# Patient Record
Sex: Female | Born: 1960 | Race: White | Hispanic: No | Marital: Married | Smoking: Former smoker
Health system: Southern US, Community
[De-identification: ages and names within clinical notes are randomized; demographics above are authoritative.]

## PROBLEM LIST (undated history)

## (undated) DIAGNOSIS — R609 Edema, unspecified: Secondary | ICD-10-CM

## (undated) DIAGNOSIS — D649 Anemia, unspecified: Secondary | ICD-10-CM

## (undated) DIAGNOSIS — M1990 Unspecified osteoarthritis, unspecified site: Secondary | ICD-10-CM

## (undated) DIAGNOSIS — Z9889 Other specified postprocedural states: Secondary | ICD-10-CM

## (undated) DIAGNOSIS — T8040XA Rh incompatibility reaction due to transfusion of blood or blood products, unspecified, initial encounter: Secondary | ICD-10-CM

## (undated) DIAGNOSIS — R112 Nausea with vomiting, unspecified: Secondary | ICD-10-CM

## (undated) DIAGNOSIS — L509 Urticaria, unspecified: Secondary | ICD-10-CM

## (undated) DIAGNOSIS — Z8489 Family history of other specified conditions: Secondary | ICD-10-CM

## (undated) DIAGNOSIS — G5600 Carpal tunnel syndrome, unspecified upper limb: Secondary | ICD-10-CM

## (undated) DIAGNOSIS — I10 Essential (primary) hypertension: Secondary | ICD-10-CM

## (undated) DIAGNOSIS — R51 Headache: Secondary | ICD-10-CM

## (undated) DIAGNOSIS — E7800 Pure hypercholesterolemia, unspecified: Secondary | ICD-10-CM

## (undated) DIAGNOSIS — K5090 Crohn's disease, unspecified, without complications: Secondary | ICD-10-CM

## (undated) DIAGNOSIS — M5020 Other cervical disc displacement, unspecified cervical region: Secondary | ICD-10-CM

## (undated) DIAGNOSIS — G709 Myoneural disorder, unspecified: Secondary | ICD-10-CM

## (undated) DIAGNOSIS — K219 Gastro-esophageal reflux disease without esophagitis: Secondary | ICD-10-CM

## (undated) HISTORY — PX: OVARY SURGERY: SHX727

## (undated) HISTORY — PX: BREAST LUMPECTOMY: SHX2

## (undated) HISTORY — PX: CHOLECYSTECTOMY: SHX55

## (undated) HISTORY — PX: IMPLANTATION VAGAL NERVE STIMULATOR: SUR692

## (undated) HISTORY — PX: APPENDECTOMY: SHX54

## (undated) HISTORY — PX: HERNIA REPAIR: SHX51

## (undated) HISTORY — PX: KNEE ARTHROSCOPY: SUR90

## (undated) HISTORY — PX: DILATION AND CURETTAGE OF UTERUS: SHX78

## (undated) HISTORY — PX: OTHER SURGICAL HISTORY: SHX169

## (undated) HISTORY — PX: TUBAL LIGATION: SHX77

## (undated) HISTORY — PX: MYOMECTOMY: SHX85

---
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
## 2009-10-16 HISTORY — PX: CARPAL TUNNEL RELEASE: SHX101

## 2011-01-15 DIAGNOSIS — L509 Urticaria, unspecified: Secondary | ICD-10-CM

## 2011-01-15 HISTORY — DX: Urticaria, unspecified: L50.9

## 2012-03-02 ENCOUNTER — Encounter (HOSPITAL_COMMUNITY): Payer: Self-pay

## 2012-03-02 ENCOUNTER — Emergency Department (HOSPITAL_COMMUNITY)
Admission: EM | Admit: 2012-03-02 | Discharge: 2012-03-02 | Disposition: A | Discharge status: Home / Self Care | Payer: Medicare Other | Attending: Emergency Medicine | Admitting: Emergency Medicine

## 2012-03-02 DIAGNOSIS — IMO0001 Reserved for inherently not codable concepts without codable children: Secondary | ICD-10-CM | POA: Insufficient documentation

## 2012-03-02 DIAGNOSIS — R000 Tachycardia, unspecified: Secondary | ICD-10-CM | POA: Insufficient documentation

## 2012-03-02 DIAGNOSIS — K5090 Crohn's disease, unspecified, without complications: Secondary | ICD-10-CM | POA: Insufficient documentation

## 2012-03-02 DIAGNOSIS — I10 Essential (primary) hypertension: Secondary | ICD-10-CM | POA: Insufficient documentation

## 2012-03-02 DIAGNOSIS — R200 Anesthesia of skin: Secondary | ICD-10-CM

## 2012-03-02 DIAGNOSIS — M79609 Pain in unspecified limb: Secondary | ICD-10-CM | POA: Insufficient documentation

## 2012-03-02 DIAGNOSIS — R51 Headache: Secondary | ICD-10-CM | POA: Insufficient documentation

## 2012-03-02 DIAGNOSIS — R209 Unspecified disturbances of skin sensation: Secondary | ICD-10-CM | POA: Insufficient documentation

## 2012-03-02 DIAGNOSIS — Z79899 Other long term (current) drug therapy: Secondary | ICD-10-CM | POA: Insufficient documentation

## 2012-03-02 DIAGNOSIS — M62838 Other muscle spasm: Secondary | ICD-10-CM | POA: Insufficient documentation

## 2012-03-02 HISTORY — DX: Crohn's disease, unspecified, without complications: K50.90

## 2012-03-02 HISTORY — DX: Essential (primary) hypertension: I10

## 2012-03-02 LAB — POCT I-STAT, CHEM 8
BUN: 14 mg/dL (ref 6–23)
Creatinine, Ser: 0.9 mg/dL (ref 0.50–1.10)
Potassium: 3.3 mEq/L — ABNORMAL LOW (ref 3.5–5.1)
Sodium: 136 mEq/L (ref 135–145)
TCO2: 24 mmol/L (ref 0–100)

## 2012-03-02 MED ORDER — OXYCODONE-ACETAMINOPHEN 5-325 MG PO TABS: 1.0000 | ORAL_TABLET | Freq: Four times a day (QID) | ORAL | Status: AC | PRN

## 2012-03-02 MED ORDER — KETOROLAC TROMETHAMINE 30 MG/ML IJ SOLN
30.0000 mg | Freq: Once | INTRAMUSCULAR | Status: AC
  Administered 2012-03-02: 30 mg via INTRAVENOUS
  Filled 2012-03-02: qty 1

## 2012-03-02 MED ORDER — DIPHENHYDRAMINE HCL 50 MG/ML IJ SOLN
25.0000 mg | Freq: Once | INTRAMUSCULAR | Status: AC
  Administered 2012-03-02: 11:00:00 via INTRAVENOUS
  Filled 2012-03-02: qty 1

## 2012-03-02 MED ORDER — ALPRAZOLAM 0.5 MG PO TABS: 0.5000 mg | ORAL_TABLET | Freq: Every evening | ORAL | Status: DC | PRN

## 2012-03-02 MED ORDER — POTASSIUM CHLORIDE CRYS ER 20 MEQ PO TBCR
20.0000 meq | EXTENDED_RELEASE_TABLET | Freq: Once | ORAL | Status: AC
  Administered 2012-03-02: 20 meq via ORAL
  Filled 2012-03-02: qty 1

## 2012-03-02 MED ORDER — OXYCODONE-ACETAMINOPHEN 5-325 MG PO TABS
1.0000 | ORAL_TABLET | Freq: Once | ORAL | Status: AC
  Administered 2012-03-02: 1 via ORAL
  Filled 2012-03-02: qty 1

## 2012-03-02 MED ORDER — LORAZEPAM 2 MG/ML IJ SOLN
1.0000 mg | Freq: Once | INTRAMUSCULAR | Status: AC
  Administered 2012-03-02: 09:00:00 via INTRAVENOUS
  Filled 2012-03-02: qty 1

## 2012-03-02 MED ORDER — LORAZEPAM 2 MG/ML IJ SOLN
1.0000 mg | Freq: Once | INTRAMUSCULAR | Status: AC
  Administered 2012-03-02: 11:00:00 via INTRAVENOUS
  Filled 2012-03-02: qty 1

## 2012-03-02 MED ORDER — SODIUM CHLORIDE 0.9 % IV BOLUS (SEPSIS)
1000.0000 mL | Freq: Once | INTRAVENOUS | Status: AC
  Administered 2012-03-02: 1000 mL via INTRAVENOUS

## 2012-03-02 NOTE — ED Notes (Signed)
Per EMS: Pt stated at 0100 she started to experience tingling and numbness in hands and legs. Right hand contraction which has progressed more than when arriving to see pt. Hx of HTN and Crohn's disease. No recent trauma. Pain scale:0/10. VS: 158/96, HR102, RR20, Sinus Tach; CBG 103. 4L [place]. Pt took Topamax 1/2 dose last night before bed. On scene pt could ambulate without problems and walked to truck. 20 Left AC locked.

## 2012-03-02 NOTE — ED Provider Notes (Signed)
History     CSN: [id number]  Arrival date & time 03/02/12  4098   First MD Initiated Contact with Patient 03/02/12 [phone]      Chief Complaint  Patient presents with  . Numbness    hands and feet    (Consider location/radiation/quality/duration/timing/severity/associated sxs/prior treatment) HPI  51 year old female with history of hypertension history of Crohn's disease presents complaining of muscle tightness and tingling sensation to her extremities. Patient states this morning she woke up experiencing a tightness sensation to both hands and feet. She describes sensation as muscle pulling tightly.  Onset was gradual, and persistent. The patient started from the distal extremities pain radiates towards her trunk. She noticed that her hands are contracted and having difficulty moving her fingers. She also complained of muscle pain due to the tightness. She was having difficulty walking and was afraid that she may fall. Patient denies fever, headache, double vision, nausea, vomiting, diarrhea, chest pain, shortness of breath, abdominal pain, back pain. She recall having headache last night and proceeds to take one half of Topamax, and a prescription that she receive several years ago for migraine headache. She denies taking Topamax a regular basis. Patient also acknowledged that she has been under a lot of stress recently due to the death of a friend 2 weeks ago and funeral a couple days ago.  She denies any recent recreational drug use or alcohol use. She does have a history of Crohn's disease but is currently uncontrolled. She has been eating and drinking as usual. Patient denies difficulty thinking or talking.  Past Medical History  Diagnosis Date  . Hypertension   . Crohn's disease     Past Surgical History  Procedure Date  . Carpal tunnel release 2011  . Cholecystectomy   . Appendectomy   . Hernia repair   . Tubal ligation     Family History  Problem Relation Age of Onset  .  Hypertension Other   . Stroke Other   . Heart attack Other     History  Substance Use Topics  . Smoking status: Former Smoker    Types: Cigarettes    Quit date: 03/02/2002  . Smokeless tobacco: Not on file  . Alcohol Use: 3.0 oz/week    5 Glasses of wine per week    OB History    Grav Para Term Preterm Abortions TAB SAB Ect Mult Living                  Review of Systems  All other systems reviewed and are negative.    Allergies  Sulfa antibiotics  Home Medications   Current Outpatient Rx  Name Route Sig Dispense Refill  . CALCIUM + D PO Oral Take 1 tablet by mouth daily.    Marland Kitchen ESOMEPRAZOLE MAGNESIUM 40 MG PO CPDR Oral Take 40 mg by mouth daily before breakfast.    . ETODOLAC 300 MG PO CAPS Oral Take 150 mg by mouth every 8 (eight) hours as needed. For migraines    . EZETIMIBE-SIMVASTATIN 10-20 MG PO TABS Oral Take 1 tablet by mouth at bedtime.    Marland Kitchen MELATIN PO Oral Take 1 tablet by mouth at bedtime as needed. For sleep    . MESALAMINE 400 MG PO TBEC Oral Take 800 mg by mouth 3 (three) times daily.    . MULTI-VITAMIN/MINERALS PO TABS Oral Take 1 tablet by mouth daily.    . OXYCODONE-ACETAMINOPHEN 5-325 MG PO TABS Oral Take 1 tablet by mouth every 6 (six)  hours as needed. For pain    . PRESCRIPTION MEDICATION Oral Take 0.5 tablets by mouth every 8 (eight) hours as needed. Topamax dose unknown. For migraines    . PRESCRIPTION MEDICATION Injection Inject 1 application as directed every 14 (fourteen) days. Vitamin B-12 injection    . VALSARTAN-HYDROCHLOROTHIAZIDE 160-25 MG PO TABS Oral Take 1 tablet by mouth daily.      BP 143/84  Pulse 103  Temp(Src) 97.9 F (36.6 C) (Oral)  Resp 20  SpO2 97%  LMP 02/19/2012  Physical Exam  Nursing note and vitals reviewed. Constitutional: She is oriented to person, place, and time. She appears well-developed and well-nourished.       Appears tearful and mildly hyperventilating.    HENT:  Head: Normocephalic and atraumatic.    Mouth/Throat: Oropharynx is clear and moist. No oropharyngeal exudate.  Eyes: Conjunctivae and EOM are normal.  Neck: Normal range of motion. Neck supple.  Cardiovascular: Regular rhythm.        tachycardic  Pulmonary/Chest: Effort normal and breath sounds normal. She has no wheezes. She has no rales. She exhibits no tenderness.  Abdominal: Soft. There is no tenderness.  Musculoskeletal:       Pt holds both hands in slight contracture (flexion), most noticeable to thumb.  Normal ROM with passive assist. No swelling or rash noted to all extremities.    Neurological: She is alert and oriented to person, place, and time. She has normal strength. No cranial nerve deficit. She displays a negative Romberg sign. Coordination and gait normal. GCS eye subscore is 4. GCS verbal subscore is 5. GCS motor subscore is 6.  Reflex Scores:      Patellar reflexes are 2+ on the right side and 2+ on the left side.      Decreased sensation to both hands with 2 point discrimination.  Normal sensation to both feet.  Normal gait, Romberg negative, normal DTR.  Normal heels to shin.    Skin: Skin is warm. No rash noted.    ED Course  Procedures (including critical care time)  Labs Reviewed - No data to display No results found.   No diagnosis found.  Results for orders placed during the hospital encounter of 03/02/12  POCT I-STAT, CHEM 8      Component Value Range   Sodium 136  135 - 145 (mEq/L)   Potassium 3.3 (*) 3.5 - 5.1 (mEq/L)   Chloride 101  96 - 112 (mEq/L)   BUN 14  6 - 23 (mg/dL)   Creatinine, Ser 1.61  0.50 - 1.10 (mg/dL)   Glucose, Bld 096 (*) 70 - 99 (mg/dL)   Calcium, Ion 0.45 (*) 1.12 - 1.32 (mmol/L)   TCO2 24  0 - 100 (mmol/L)   Hemoglobin 13.9  12.0 - 15.0 (g/dL)   HCT 40.9  81.1 - 91.4 (%)   No results found.     MDM  Pt experienced dystonia with muscle spasm and contraction to her hands.  Doubt stroke related.  Having decreased sensation to hands bilat.  Has been under a lot  of stress.  This may induce carpal spasm.  Plan to give ativan, IVF, and toradol.  Will continue to monitor.  Discussed with my attending.     10:51 AM Pt experienced some improvement with 1mg  ativan and toradol.  Labs unremarkable.  Mild hypokalemia of 3.3.  Supplementation given.    1:17 PM Pt having minimal improvement.  My attending has seen and evaluated pt.  Plan to d/c  with ativan and pain medication along with f/u to PCP in [place].  Strict f/u precaution explained.  Pt currently stable, normal vital sign, A&Ox4    Fayrene Helper, PA-C 03/02/12 1318

## 2012-03-02 NOTE — Discharge Instructions (Signed)
Hypertonia Disease Hypertonia is a condition marked by an abnormal increase in muscle tension and a reduced ability of a muscle to stretch. It is caused by injury to pathways in the central nervous system. These pathways carry information from the central nervous system to the muscles. They control:  Posture.   Muscle tone.   Reflexes.  When the injury occurs in children under the age of 2, the term cerebral palsy is often used. Hypertonia can be so severe that joint movement is not possible. Untreated, it can lead to loss of function and deformity.  CAUSES  Hypertonia may result from:   Injury.   Disease.   Conditions such as:   Spasticity.   Dystonia.   Rigidity.  SYMPTOMS  Symptoms of the three conditions noted above are: Spastic hypertonia:  Uncontrollable muscle spasms.   Stiffening or straightening out of muscles.   Shock-like contractions of all or part of a group of muscles.   Abnormal muscle tone.  It is seen in other disorders. They include:  Cerebral palsy.   Stroke.   Spinal cord injury.  Dystonic hypertonia:   Muscle resistance to passive stretching. A therapist gently stretches the inactive contracted muscle to a comfortable length. This is done at very low speeds of movement.   A tendency of a limb to return to a fixed involuntary (and sometimes abnormal) posture after movement.  Rigidity:   An involuntary stiffening or straightening out of muscles.   Abnormally increased muscle tone.   Reduced ability of a muscle to stretch.   This type of hypertonia is most common in parkinsonism  TREATMENT   Drugs such as:   Baclofen, diazepam, and dantrolene may be prescribed to reduce spasticity.   Injections of botulinum toxin have recently been used for longstanding hypertonia in cerebral palsy, spasticity, and other disorders.   Rehabilitative treatment may involve:   Range of motion exercises.   Active stretching exercises.   Occupational  therapy.   In severe cases, a surgical procedure may be used. In it, the nerves that cause spasticity are cut.   Dystonic hypertonia and rigidity can be treated with therapies aimed at the underlying disorders.  PROGNOSIS Hypertonia is sometimes painful. It may lead to:  Functional limitations.   Disability.   In severe cases, reduced quality of life.  Document Released: 09/22/2002 Document Revised: 09/21/2011 Document Reviewed: 10/02/2005 [facility] Patient Information 2012 [place], [place].

## 2012-03-02 NOTE — ED Provider Notes (Signed)
Medical screening examination/treatment/procedure(s) were conducted as a shared visit with non-physician practitioner(s) and myself.  I personally evaluated the patient during the encounter  She awoke today with her "body locked up". This has caused her to have pain with movements of shoulders, elbows, wrists, and hands. Does have sensation of generalized numbness. She denies trauma. She reports her stress is "through the roof". She has multiple stressors. She does not have a therapist. Her PCP is in [place]. On exam, she is alert, cooperative, and very anxious. Neurologic exam is nonfocal. She has intermittent clenching of both hands that voluntarily relax, on command when distracted. I doubt CVA, acute spinal disorder, metabolic instability or occult infection. Patient stable for discharge with outpatient management.  Flint Melter, MD 03/02/12 1300

## 2012-03-03 NOTE — ED Provider Notes (Signed)
Medical screening examination/treatment/procedure(s) were conducted as a shared visit with non-physician practitioner(s) and myself.  I personally evaluated the patient during the encounter   Severe stress with somatic manifestation. Dobt CVA, Carpal Pedal Spasm, medication rxn, or metabolic instability.  Flint Melter, MD 03/03/12 [phone]

## 2012-03-15 ENCOUNTER — Other Ambulatory Visit: Payer: Self-pay | Admitting: Orthopedic Surgery

## 2012-03-26 NOTE — Pre-Procedure Instructions (Signed)
20 Tanya Mcdaniel  03/26/2012   Your procedure is scheduled on:  Wednesday April 03, 2012.  Report to [facility] at 0630 AM.  Call this number if you have problems the morning of surgery: [phone]   Remember:   Do not eat food or drink:After Midnight.    Take these medicines the morning of surgery with A SIP OF WATER: Alprazolam (Xanax), Esomeprazole (Nexium), Oxycodone (Percocet) if needed for pain, and Mesalamine (Asacol).   Do not wear jewelry, make-up or nail polish.  Do not wear lotions, powders, or perfumes.   Do not shave 48 hours prior to surgery.  Do not bring valuables to the hospital.  Contacts, dentures or bridgework may not be worn into surgery.  Leave suitcase in the car. After surgery it may be brought to your room.  For patients admitted to the hospital, checkout time is 11:00 AM the day of discharge.   Patients discharged the day of surgery will not be allowed to drive home.  Name and phone number of your driver:   Special Instructions: CHG Shower Use Special Wash: 1/2 bottle night before surgery and 1/2 bottle morning of surgery.   Please read over the following fact sheets that you were given: Pain Booklet, Coughing and Deep Breathing, MRSA Information and Surgical Site Infection Prevention

## 2012-03-27 ENCOUNTER — Encounter (HOSPITAL_COMMUNITY)
Admission: RE | Admit: 2012-03-27 | Discharge: 2012-03-27 | Disposition: A | Discharge status: Home / Self Care | Payer: Medicare Other | Source: Ambulatory Visit | Attending: Orthopedic Surgery | Admitting: Orthopedic Surgery

## 2012-03-27 ENCOUNTER — Encounter (HOSPITAL_COMMUNITY): Payer: Self-pay | Admitting: Respiratory Therapy

## 2012-03-27 ENCOUNTER — Encounter (HOSPITAL_COMMUNITY): Payer: Self-pay

## 2012-03-27 HISTORY — DX: Urticaria, unspecified: L50.9

## 2012-03-27 HISTORY — DX: Unspecified osteoarthritis, unspecified site: M19.90

## 2012-03-27 HISTORY — DX: Nausea with vomiting, unspecified: R11.2

## 2012-03-27 HISTORY — DX: Other specified postprocedural states: Z98.890

## 2012-03-27 HISTORY — DX: Other cervical disc displacement, unspecified cervical region: M50.20

## 2012-03-27 HISTORY — DX: Anemia, unspecified: D64.9

## 2012-03-27 HISTORY — DX: Family history of other specified conditions: Z84.89

## 2012-03-27 HISTORY — DX: Pure hypercholesterolemia, unspecified: E78.00

## 2012-03-27 HISTORY — DX: Headache: R51

## 2012-03-27 HISTORY — DX: Gastro-esophageal reflux disease without esophagitis: K21.9

## 2012-03-27 LAB — DIFFERENTIAL
Basophils Absolute: 0 10*3/uL (ref 0.0–0.1)
Basophils Relative: 0 % (ref 0–1)
Eosinophils Absolute: 0.1 10*3/uL (ref 0.0–0.7)
Lymphs Abs: 2.8 10*3/uL (ref 0.7–4.0)
Neutrophils Relative %: 64 % (ref 43–77)

## 2012-03-27 LAB — CBC
MCV: 91.7 fL (ref 78.0–100.0)
Platelets: 374 10*3/uL (ref 150–400)
RBC: 4.46 MIL/uL (ref 3.87–5.11)
RDW: 12.8 % (ref 11.5–15.5)
WBC: 10.4 10*3/uL (ref 4.0–10.5)

## 2012-03-27 LAB — URINALYSIS, ROUTINE W REFLEX MICROSCOPIC
Bilirubin Urine: NEGATIVE
Ketones, ur: NEGATIVE mg/dL
Nitrite: NEGATIVE
Protein, ur: NEGATIVE mg/dL
Specific Gravity, Urine: 1.01 (ref 1.005–1.030)
Urobilinogen, UA: 0.2 mg/dL (ref 0.0–1.0)

## 2012-03-27 LAB — COMPREHENSIVE METABOLIC PANEL
Alkaline Phosphatase: 91 U/L (ref 39–117)
BUN: 10 mg/dL (ref 6–23)
Chloride: 97 mEq/L (ref 96–112)
GFR calc Af Amer: >90 mL/min (ref >90)
GFR calc non Af Amer: >90 mL/min (ref >90)
Glucose, Bld: 123 mg/dL — ABNORMAL HIGH (ref 70–99)
Sodium: 137 mEq/L (ref 135–145)
Total Protein: 7.8 g/dL (ref 6.0–8.3)

## 2012-03-27 LAB — PROTIME-INR
INR: 0.94 (ref 0.00–1.49)
Prothrombin Time: 12.8 seconds (ref 11.6–15.2)

## 2012-04-02 MED ORDER — CLINDAMYCIN PHOSPHATE 600 MG/50ML IV SOLN
600.0000 mg | INTRAVENOUS | Status: AC
  Administered 2012-04-03: 600 mg via INTRAVENOUS
  Filled 2012-04-02: qty 50

## 2012-04-03 ENCOUNTER — Inpatient Hospital Stay (HOSPITAL_COMMUNITY): Payer: Medicare Other | Admitting: *Deleted

## 2012-04-03 ENCOUNTER — Encounter (HOSPITAL_COMMUNITY): Payer: Self-pay | Admitting: *Deleted

## 2012-04-03 ENCOUNTER — Inpatient Hospital Stay (HOSPITAL_COMMUNITY): Payer: Medicare Other

## 2012-04-03 ENCOUNTER — Inpatient Hospital Stay (HOSPITAL_COMMUNITY)
Admission: RE | Admit: 2012-04-03 | Discharge: 2012-04-04 | DRG: 473 | Disposition: A | Discharge status: Home / Self Care | Payer: Medicare Other | Source: Ambulatory Visit | Attending: Orthopedic Surgery | Admitting: Orthopedic Surgery

## 2012-04-03 ENCOUNTER — Encounter (HOSPITAL_COMMUNITY)
Admission: RE | Disposition: A | Discharge status: Home / Self Care | Payer: Self-pay | Source: Ambulatory Visit | Attending: Orthopedic Surgery

## 2012-04-03 DIAGNOSIS — Z882 Allergy status to sulfonamides status: Secondary | ICD-10-CM

## 2012-04-03 DIAGNOSIS — E119 Type 2 diabetes mellitus without complications: Secondary | ICD-10-CM | POA: Diagnosis present

## 2012-04-03 DIAGNOSIS — E7800 Pure hypercholesterolemia, unspecified: Secondary | ICD-10-CM | POA: Diagnosis present

## 2012-04-03 DIAGNOSIS — Z87891 Personal history of nicotine dependence: Secondary | ICD-10-CM

## 2012-04-03 DIAGNOSIS — Z8249 Family history of ischemic heart disease and other diseases of the circulatory system: Secondary | ICD-10-CM

## 2012-04-03 DIAGNOSIS — M4712 Other spondylosis with myelopathy, cervical region: Principal | ICD-10-CM | POA: Diagnosis present

## 2012-04-03 DIAGNOSIS — Z01812 Encounter for preprocedural laboratory examination: Secondary | ICD-10-CM

## 2012-04-03 DIAGNOSIS — Z823 Family history of stroke: Secondary | ICD-10-CM

## 2012-04-03 DIAGNOSIS — Z01818 Encounter for other preprocedural examination: Secondary | ICD-10-CM

## 2012-04-03 DIAGNOSIS — K219 Gastro-esophageal reflux disease without esophagitis: Secondary | ICD-10-CM | POA: Diagnosis present

## 2012-04-03 DIAGNOSIS — I10 Essential (primary) hypertension: Secondary | ICD-10-CM | POA: Diagnosis present

## 2012-04-03 DIAGNOSIS — G959 Disease of spinal cord, unspecified: Secondary | ICD-10-CM

## 2012-04-03 HISTORY — PX: ANTERIOR CERVICAL DECOMP/DISCECTOMY FUSION: SHX1161

## 2012-04-03 SURGERY — ANTERIOR CERVICAL DECOMPRESSION/DISCECTOMY FUSION 2 LEVELS
Anesthesia: General | Site: Spine Cervical | Laterality: Bilateral | Wound class: Clean

## 2012-04-03 MED ORDER — HYDROCHLOROTHIAZIDE 25 MG PO TABS
25.0000 mg | ORAL_TABLET | Freq: Every day | ORAL | Status: DC
  Filled 2012-04-03 (×2): qty 1

## 2012-04-03 MED ORDER — POTASSIUM CHLORIDE IN NACL 20-0.9 MEQ/L-% IV SOLN
INTRAVENOUS | Status: DC
  Filled 2012-04-03 (×3): qty 1000

## 2012-04-03 MED ORDER — DIAZEPAM 5 MG PO TABS
5.0000 mg | ORAL_TABLET | Freq: Four times a day (QID) | ORAL | Status: DC | PRN
  Administered 2012-04-03 – 2012-04-04 (×3): 5 mg via ORAL
  Filled 2012-04-03 (×3): qty 1

## 2012-04-03 MED ORDER — THROMBIN 20000 UNITS EX SOLR
CUTANEOUS | Status: DC | PRN
  Administered 2012-04-03: 20000 [IU] via TOPICAL

## 2012-04-03 MED ORDER — THROMBIN 20000 UNITS EX SOLR
CUTANEOUS | Status: AC
  Filled 2012-04-03: qty 20000

## 2012-04-03 MED ORDER — FERROUS SULFATE 325 (65 FE) MG PO TABS
325.0000 mg | ORAL_TABLET | Freq: Three times a day (TID) | ORAL | Status: DC
  Filled 2012-04-03 (×5): qty 1

## 2012-04-03 MED ORDER — IRBESARTAN 150 MG PO TABS
150.0000 mg | ORAL_TABLET | Freq: Every day | ORAL | Status: DC
  Filled 2012-04-03 (×2): qty 1

## 2012-04-03 MED ORDER — MEPERIDINE HCL 25 MG/ML IJ SOLN: 6.2500 mg | INTRAMUSCULAR | Status: DC | PRN

## 2012-04-03 MED ORDER — NEOSTIGMINE METHYLSULFATE 1 MG/ML IJ SOLN
INTRAMUSCULAR | Status: DC | PRN
  Administered 2012-04-03: 4 mg via INTRAVENOUS

## 2012-04-03 MED ORDER — ACETAMINOPHEN 325 MG PO TABS: 650.0000 mg | ORAL_TABLET | ORAL | Status: DC | PRN

## 2012-04-03 MED ORDER — VALSARTAN-HYDROCHLOROTHIAZIDE 160-25 MG PO TABS: 1.0000 | ORAL_TABLET | Freq: Every day | ORAL | Status: DC

## 2012-04-03 MED ORDER — DOCUSATE SODIUM 100 MG PO CAPS
100.0000 mg | ORAL_CAPSULE | Freq: Two times a day (BID) | ORAL | Status: DC
  Administered 2012-04-03: 100 mg via ORAL
  Filled 2012-04-03: qty 1

## 2012-04-03 MED ORDER — SUFENTANIL CITRATE 50 MCG/ML IV SOLN
INTRAVENOUS | Status: DC | PRN
  Administered 2012-04-03: 20 ug via INTRAVENOUS
  Administered 2012-04-03 (×4): 10 ug via INTRAVENOUS

## 2012-04-03 MED ORDER — SODIUM CHLORIDE 0.9 % IJ SOLN: 3.0000 mL | INTRAMUSCULAR | Status: DC | PRN

## 2012-04-03 MED ORDER — MORPHINE SULFATE 2 MG/ML IJ SOLN
2.0000 mg | INTRAMUSCULAR | Status: DC | PRN
  Administered 2012-04-03 (×2): 2 mg via INTRAVENOUS
  Filled 2012-04-03 (×2): qty 1

## 2012-04-03 MED ORDER — PANTOPRAZOLE SODIUM 40 MG PO TBEC: 80.0000 mg | DELAYED_RELEASE_TABLET | Freq: Every day | ORAL | Status: DC

## 2012-04-03 MED ORDER — POVIDONE-IODINE 7.5 % EX SOLN
Freq: Once | CUTANEOUS | Status: DC
  Filled 2012-04-03: qty 118

## 2012-04-03 MED ORDER — LACTATED RINGERS IV SOLN
INTRAVENOUS | Status: DC | PRN
  Administered 2012-04-03 (×2): via INTRAVENOUS

## 2012-04-03 MED ORDER — SODIUM CHLORIDE 0.9 % IV SOLN: 250.0000 mL | INTRAVENOUS | Status: DC

## 2012-04-03 MED ORDER — MIDAZOLAM HCL 5 MG/5ML IJ SOLN
INTRAMUSCULAR | Status: DC | PRN
  Administered 2012-04-03: 2 mg via INTRAVENOUS

## 2012-04-03 MED ORDER — HYDROMORPHONE HCL PF 1 MG/ML IJ SOLN
0.2500 mg | INTRAMUSCULAR | Status: DC | PRN
  Administered 2012-04-03 (×4): 0.5 mg via INTRAVENOUS

## 2012-04-03 MED ORDER — SODIUM CHLORIDE 0.9 % IJ SOLN: 3.0000 mL | Freq: Two times a day (BID) | INTRAMUSCULAR | Status: DC

## 2012-04-03 MED ORDER — PROMETHAZINE HCL 25 MG/ML IJ SOLN: 6.2500 mg | INTRAMUSCULAR | Status: DC | PRN

## 2012-04-03 MED ORDER — ALPRAZOLAM 0.25 MG PO TABS: 0.2500 mg | ORAL_TABLET | Freq: Every evening | ORAL | Status: DC | PRN

## 2012-04-03 MED ORDER — 0.9 % SODIUM CHLORIDE (POUR BTL) OPTIME
TOPICAL | Status: DC | PRN
  Administered 2012-04-03: 1000 mL

## 2012-04-03 MED ORDER — MULTI-VITAMIN/MINERALS PO TABS: 1.0000 | ORAL_TABLET | Freq: Every day | ORAL | Status: DC

## 2012-04-03 MED ORDER — ZOLPIDEM TARTRATE 5 MG PO TABS: 5.0000 mg | ORAL_TABLET | Freq: Every evening | ORAL | Status: DC | PRN

## 2012-04-03 MED ORDER — SENNA 8.6 MG PO TABS
1.0000 | ORAL_TABLET | Freq: Two times a day (BID) | ORAL | Status: DC
  Administered 2012-04-03: 8.6 mg via ORAL
  Filled 2012-04-03 (×3): qty 1

## 2012-04-03 MED ORDER — SCOPOLAMINE 1 MG/3DAYS TD PT72
1.0000 | MEDICATED_PATCH | TRANSDERMAL | Status: DC
  Administered 2012-04-03: 1 via TRANSDERMAL
  Filled 2012-04-03: qty 1

## 2012-04-03 MED ORDER — ALUM & MAG HYDROXIDE-SIMETH 200-200-20 MG/5ML PO SUSP: 30.0000 mL | Freq: Four times a day (QID) | ORAL | Status: DC | PRN

## 2012-04-03 MED ORDER — PHENOL 1.4 % MT LIQD
1.0000 | OROMUCOSAL | Status: DC | PRN
  Administered 2012-04-04: 1 via OROMUCOSAL
  Filled 2012-04-03: qty 177

## 2012-04-03 MED ORDER — BUPIVACAINE-EPINEPHRINE PF 0.25-1:200000 % IJ SOLN
INTRAMUSCULAR | Status: AC
  Filled 2012-04-03: qty 30

## 2012-04-03 MED ORDER — ACETAMINOPHEN 650 MG RE SUPP: 650.0000 mg | RECTAL | Status: DC | PRN

## 2012-04-03 MED ORDER — HYDROMORPHONE HCL PF 1 MG/ML IJ SOLN
INTRAMUSCULAR | Status: AC
  Administered 2012-04-03: 0.5 mg via INTRAVENOUS
  Filled 2012-04-03: qty 1

## 2012-04-03 MED ORDER — THROMBIN 20000 UNITS EX KIT
PACK | CUTANEOUS | Status: DC | PRN
  Administered 2012-04-03: 10:00:00 via TOPICAL

## 2012-04-03 MED ORDER — MIDAZOLAM HCL 2 MG/2ML IJ SOLN
0.5000 mg | Freq: Once | INTRAMUSCULAR | Status: AC | PRN
  Administered 2012-04-03: 1 mg via INTRAVENOUS

## 2012-04-03 MED ORDER — CEFAZOLIN SODIUM 1-5 GM-% IV SOLN
1.0000 g | Freq: Three times a day (TID) | INTRAVENOUS | Status: AC
  Administered 2012-04-03: 1 g via INTRAVENOUS
  Filled 2012-04-03 (×2): qty 50

## 2012-04-03 MED ORDER — DEXAMETHASONE SODIUM PHOSPHATE 10 MG/ML IJ SOLN
INTRAMUSCULAR | Status: DC | PRN
  Administered 2012-04-03: 4 mg via INTRAVENOUS

## 2012-04-03 MED ORDER — MENTHOL 3 MG MT LOZG
1.0000 | LOZENGE | OROMUCOSAL | Status: DC | PRN
  Administered 2012-04-04: 3 mg via ORAL
  Filled 2012-04-03: qty 9

## 2012-04-03 MED ORDER — GLYCOPYRROLATE 0.2 MG/ML IJ SOLN
INTRAMUSCULAR | Status: DC | PRN
  Administered 2012-04-03: .5 mg via INTRAVENOUS

## 2012-04-03 MED ORDER — ESTRADIOL 0.52 MG/0.87 GM (0.06%) TD GEL: 1.0000 "application " | Freq: Every day | TRANSDERMAL | Status: DC

## 2012-04-03 MED ORDER — ROCURONIUM BROMIDE 100 MG/10ML IV SOLN
INTRAVENOUS | Status: DC | PRN
  Administered 2012-04-03: 50 mg via INTRAVENOUS

## 2012-04-03 MED ORDER — ADULT MULTIVITAMIN W/MINERALS CH
1.0000 | ORAL_TABLET | Freq: Every day | ORAL | Status: DC
  Filled 2012-04-03 (×2): qty 1

## 2012-04-03 MED ORDER — MIDAZOLAM HCL 2 MG/2ML IJ SOLN
INTRAMUSCULAR | Status: AC
  Administered 2012-04-03: 1 mg via INTRAVENOUS
  Filled 2012-04-03: qty 2

## 2012-04-03 MED ORDER — OXYCODONE-ACETAMINOPHEN 5-325 MG PO TABS
1.0000 | ORAL_TABLET | ORAL | Status: DC | PRN
  Administered 2012-04-03 – 2012-04-04 (×3): 2 via ORAL
  Filled 2012-04-03 (×3): qty 2

## 2012-04-03 MED ORDER — PROGESTERONE MICRONIZED 100 MG PO CAPS
100.0000 mg | ORAL_CAPSULE | Freq: Every day | ORAL | Status: DC
  Filled 2012-04-03 (×2): qty 1

## 2012-04-03 MED ORDER — ONDANSETRON HCL 4 MG/2ML IJ SOLN
INTRAMUSCULAR | Status: DC | PRN
  Administered 2012-04-03: 4 mg via INTRAVENOUS

## 2012-04-03 MED ORDER — EZETIMIBE-SIMVASTATIN 10-20 MG PO TABS
1.0000 | ORAL_TABLET | Freq: Every day | ORAL | Status: DC
  Administered 2012-04-03: 1 via ORAL
  Filled 2012-04-03 (×2): qty 1

## 2012-04-03 MED ORDER — BUPIVACAINE-EPINEPHRINE 0.25% -1:200000 IJ SOLN
INTRAMUSCULAR | Status: DC | PRN
  Administered 2012-04-03: 2 mL

## 2012-04-03 MED ORDER — PROPOFOL 10 MG/ML IV BOLUS
INTRAVENOUS | Status: DC | PRN
  Administered 2012-04-03: 130 mg via INTRAVENOUS

## 2012-04-03 MED ORDER — ONDANSETRON HCL 4 MG/2ML IJ SOLN: 4.0000 mg | INTRAMUSCULAR | Status: DC | PRN

## 2012-04-03 SURGICAL SUPPLY — 75 items
BENZOIN TINCTURE PRP APPL 2/3 (GAUZE/BANDAGES/DRESSINGS) IMPLANT
BIT DRILL NEURO 2X3.1 SFT TUCH (MISCELLANEOUS) ×1 IMPLANT
BLADE LONG MED 31X9 (MISCELLANEOUS) IMPLANT
BLADE SURG 15 STRL LF DISP TIS (BLADE) ×1 IMPLANT
BLADE SURG 15 STRL SS (BLADE) ×1
BLADE SURG ROTATE 9660 (MISCELLANEOUS) ×2 IMPLANT
BUR MATCHSTICK NEURO 3.0 LAGG (BURR) ×2 IMPLANT
CARTRIDGE OIL MAESTRO DRILL (MISCELLANEOUS) ×1 IMPLANT
CLOTH BEACON ORANGE TIMEOUT ST (SAFETY) ×2 IMPLANT
CLSR STERI-STRIP ANTIMIC 1/2X4 (GAUZE/BANDAGES/DRESSINGS) ×2 IMPLANT
COLLAR CERV LO CONTOUR FIRM DE (SOFTGOODS) IMPLANT
CORDS BIPOLAR (ELECTRODE) ×2 IMPLANT
COVER SURGICAL LIGHT HANDLE (MISCELLANEOUS) ×2 IMPLANT
CRADLE DONUT ADULT HEAD (MISCELLANEOUS) ×2 IMPLANT
DEVICE ENDSKLTN TCERV VBR SM 6 (Orthopedic Implant) ×1 IMPLANT
DIFFUSER DRILL AIR PNEUMATIC (MISCELLANEOUS) ×2 IMPLANT
DRAIN JACKSON RD 7FR 3/32 (WOUND CARE) IMPLANT
DRAPE C-ARM 42X72 X-RAY (DRAPES) ×2 IMPLANT
DRAPE POUCH INSTRU U-SHP 10X18 (DRAPES) ×2 IMPLANT
DRAPE SURG 17X23 STRL (DRAPES) ×6 IMPLANT
DRILL NEURO 2X3.1 SOFT TOUCH (MISCELLANEOUS) ×2
DURAPREP 26ML APPLICATOR (WOUND CARE) ×2 IMPLANT
ELECT COATED BLADE 2.86 ST (ELECTRODE) ×2 IMPLANT
ELECT REM PT RETURN 9FT ADLT (ELECTROSURGICAL) ×2
ELECTRODE REM PT RTRN 9FT ADLT (ELECTROSURGICAL) ×1 IMPLANT
ENDOSKELETON T CERV VBR SM 6MM (Orthopedic Implant) ×2 IMPLANT
EVACUATOR SILICONE 100CC (DRAIN) IMPLANT
GAUZE SPONGE 4X4 16PLY XRAY LF (GAUZE/BANDAGES/DRESSINGS) IMPLANT
GLOVE BIO SURGEON STRL SZ8 (GLOVE) ×2 IMPLANT
GLOVE BIOGEL PI IND STRL 8 (GLOVE) ×1 IMPLANT
GLOVE BIOGEL PI INDICATOR 8 (GLOVE) ×1
GLOVE EUDERMIC 7 POWDERFREE (GLOVE) ×2 IMPLANT
GLOVE SURG SS PI 7.0 STRL IVOR (GLOVE) ×2 IMPLANT
GOWN STRL NON-REIN LRG LVL3 (GOWN DISPOSABLE) IMPLANT
GOWN STRL REIN XL XLG (GOWN DISPOSABLE) ×4 IMPLANT
INTERLOCK LRDTC CRVCL VBR 6MM (Bone Implant) ×1 IMPLANT
IV CATH 14GX2 1/4 (CATHETERS) ×2 IMPLANT
KIT BASIN OR (CUSTOM PROCEDURE TRAY) ×2 IMPLANT
KIT ROOM TURNOVER OR (KITS) ×2 IMPLANT
LORDOTIC CERVICAL VBR 6MM SM (Bone Implant) ×2 IMPLANT
MANIFOLD NEPTUNE II (INSTRUMENTS) ×2 IMPLANT
NEEDLE 27GAX1X1/2 (NEEDLE) ×2 IMPLANT
NEEDLE SPNL 20GX3.5 QUINCKE YW (NEEDLE) ×2 IMPLANT
NS IRRIG 1000ML POUR BTL (IV SOLUTION) ×2 IMPLANT
OIL CARTRIDGE MAESTRO DRILL (MISCELLANEOUS) ×2
PACK ORTHO CERVICAL (CUSTOM PROCEDURE TRAY) ×2 IMPLANT
PAD ARMBOARD 7.5X6 YLW CONV (MISCELLANEOUS) ×4 IMPLANT
PATTIES SURGICAL .5 X.5 (GAUZE/BANDAGES/DRESSINGS) IMPLANT
PATTIES SURGICAL .5 X1 (DISPOSABLE) ×2 IMPLANT
PIN DISTRACTION 12MM (PIN) ×2
PIN DISTRACTION 14MM (PIN) IMPLANT
PIN DSTRCT 12XNS SS ACIS (PIN) ×2 IMPLANT
PLATE LEVEL 2 26MM (Plate) ×2 IMPLANT
PUTTY BONE DBX 2.5 MIS (Bone Implant) ×2 IMPLANT
SCREW 4.0X16MM (Screw) ×12 IMPLANT
SPONGE GAUZE 4X4 12PLY (GAUZE/BANDAGES/DRESSINGS) IMPLANT
SPONGE INTESTINAL PEANUT (DISPOSABLE) ×2 IMPLANT
SPONGE SURGIFOAM ABS GEL 100 (HEMOSTASIS) ×2 IMPLANT
STRIP CLOSURE SKIN 1/2X4 (GAUZE/BANDAGES/DRESSINGS) IMPLANT
SURGIFLO TRUKIT (HEMOSTASIS) IMPLANT
SUT MNCRL AB 4-0 PS2 18 (SUTURE) ×2 IMPLANT
SUT SILK 4 0 (SUTURE)
SUT SILK 4-0 18XBRD TIE 12 (SUTURE) IMPLANT
SUT VIC AB 1 CT1 27 (SUTURE)
SUT VIC AB 1 CT1 27XBRD ANBCTR (SUTURE) IMPLANT
SUT VIC AB 2-0 CT2 18 VCP726D (SUTURE) ×2 IMPLANT
SYR BULB IRRIGATION 50ML (SYRINGE) ×2 IMPLANT
SYR CONTROL 10ML LL (SYRINGE) ×4 IMPLANT
TAPE CLOTH 4X10 WHT NS (GAUZE/BANDAGES/DRESSINGS) IMPLANT
TAPE CLOTH SURG 4X10 WHT LF (GAUZE/BANDAGES/DRESSINGS) ×2 IMPLANT
TAPE UMBILICAL COTTON 1/8X30 (MISCELLANEOUS) ×2 IMPLANT
TOWEL OR 17X24 6PK STRL BLUE (TOWEL DISPOSABLE) ×2 IMPLANT
TOWEL OR 17X26 10 PK STRL BLUE (TOWEL DISPOSABLE) ×2 IMPLANT
WATER STERILE IRR 1000ML POUR (IV SOLUTION) IMPLANT
YANKAUER SUCT BULB TIP NO VENT (SUCTIONS) ×2 IMPLANT

## 2012-04-03 NOTE — H&P (Signed)
PREOPERATIVE H&P  Chief Complaint: Myelopathy  HPI: Tanya Mcdaniel is a 51 y.o. female who presents with arm pain and radiculopathy  Past Medical History  Diagnosis Date  . Hypertension   . Crohn's disease   . GERD (gastroesophageal reflux disease)   . Anemia   . Hypercholesterolemia   . Cervical herniated disc   . Crohn's disease   . Urticaria 01/2011  . PONV (postoperative nausea and vomiting)   . Family history of anesthesia complication     mother difficulty waking up  . Headache   . Diabetes mellitus     borderline  . Arthritis    Past Surgical History  Procedure Date  . Carpal tunnel release 2011  . Cholecystectomy   . Appendectomy   . Hernia repair   . Tubal ligation   . Bladder sling   . Knee arthroscopy     Left   History   Social History  . Marital Status: Married    Spouse Name: N/A    Number of Children: N/A  . Years of Education: N/A   Social History Main Topics  . Smoking status: Former Smoker    Types: Cigarettes    Quit date: 03/02/2002  . Smokeless tobacco: Not on file  . Alcohol Use: 3.0 oz/week    5 Glasses of wine per week  . Drug Use: No  . Sexually Active:    Other Topics Concern  . Not on file   Social History Narrative  . No narrative on file   Family History  Problem Relation Age of Onset  . Hypertension Other   . Stroke Other   . Heart attack Other    Allergies  Allergen Reactions  . Sulfa Antibiotics Rash   Prior to Admission medications   Medication Sig Start Date End Date Taking? Authorizing Provider  ALPRAZolam (XANAX) 0.25 MG tablet Take 0.25 mg by mouth at bedtime as needed.   Yes Historical Provider, MD  Calcium Carbonate (CALCIUM 500 PO) Take 500 mg by mouth 2 (two) times daily.   Yes Historical Provider, MD  cholecalciferol (VITAMIN D) 1000 UNITS tablet Take 1,000 Units by mouth 2 (two) times daily.   Yes Historical Provider, MD  cyanocobalamin (,VITAMIN B-12,) 1000 MCG/ML injection Inject 1,000 mcg into the  muscle every 14 (fourteen) days.   Yes Historical Provider, MD  esomeprazole (NEXIUM) 40 MG capsule Take 40 mg by mouth daily before breakfast.   Yes Historical Provider, MD  Estradiol (ELESTRIN) 0.52 MG/0.87 GM (0.06%) GEL Apply 1 application topically daily.   Yes Historical Provider, MD  etodolac (LODINE) 300 MG capsule Take 150 mg by mouth every 8 (eight) hours as needed. For migraines   Yes Historical Provider, MD  ezetimibe-simvastatin (VYTORIN) 10-20 MG per tablet Take 1 tablet by mouth at bedtime.   Yes Historical Provider, MD  ferrous sulfate dried (SLOW FE) 160 (50 FE) MG TBCR Take 160 mg by mouth 2 (two) times daily with a meal.   Yes Historical Provider, MD  Melatonin-Pyridoxine (MELATIN PO) Take 1 tablet by mouth at bedtime as needed. For sleep   Yes Historical Provider, MD  mesalamine (ASACOL) 400 MG EC tablet Take 800 mg by mouth 4 (four) times daily.    Yes Historical Provider, MD  Multiple Vitamins-Minerals (MULTIVITAMIN WITH MINERALS) tablet Take 1 tablet by mouth daily.   Yes Historical Provider, MD  oxyCODONE-acetaminophen (PERCOCET) 5-325 MG per tablet Take 1 tablet by mouth every 8 (eight) hours as needed. For pain  Yes Historical Provider, MD  progesterone (PROMETRIUM) 100 MG capsule Take 100 mg by mouth daily.   Yes Historical Provider, MD  valsartan-hydrochlorothiazide (DIOVAN-HCT) 160-25 MG per tablet Take 1 tablet by mouth daily.   Yes Historical Provider, MD     All other systems have been reviewed and were otherwise negative with the exception of those mentioned in the HPI and as above.  Physical Exam: Filed Vitals:   04/03/12 0638  BP: 150/104  Pulse: 92  Temp: 98.2 F (36.8 C)  Resp: 20    General: Alert, no acute distress Cardiovascular: No pedal edema Respiratory: No cyanosis, no use of accessory musculature GI: No organomegaly, abdomen is soft and non-tender Skin: No lesions in the area of chief complaint Neurologic: Sensation intact  distally Psychiatric: Patient is competent for consent with normal mood and affect Lymphatic: No axillary or cervical lymphadenopathy  MUSCULOSKELETAL: + TTP posterior neck  Assessment/Plan: Myelopathy Plan for Procedure(s): ANTERIOR CERVICAL DECOMPRESSION/DISCECTOMY FUSION 2 LEVELS   Emilee Hero, MD 04/03/2012 7:40 AM

## 2012-04-03 NOTE — Transfer of Care (Signed)
Immediate Anesthesia Transfer of Care Note  Patient: Tanya Mcdaniel  Procedure(s) Performed: Procedure(s) (LRB): ANTERIOR CERVICAL DECOMPRESSION/DISCECTOMY FUSION 2 LEVELS (Bilateral)  Patient Location: PACU  Anesthesia Type: General  Level of Consciousness: awake, alert  and oriented  Airway & Oxygen Therapy: Patient Spontanous Breathing and Patient connected to nasal cannula oxygen  Post-op Assessment: Report given to PACU RN  Post vital signs: Reviewed and stable  Complications: No apparent anesthesia complications

## 2012-04-03 NOTE — Preoperative (Signed)
Beta Blockers   Reason not to administer Beta Blockers:Not Applicable 

## 2012-04-03 NOTE — Progress Notes (Signed)
UR COMPLETED  

## 2012-04-03 NOTE — Anesthesia Postprocedure Evaluation (Signed)
  Anesthesia Post-op Note  Patient: Tanya Mcdaniel  Procedure(s) Performed: Procedure(s) (LRB): ANTERIOR CERVICAL DECOMPRESSION/DISCECTOMY FUSION 2 LEVELS (Bilateral)  Patient Location: PACU  Anesthesia Type: General  Level of Consciousness: awake, alert  and oriented  Airway and Oxygen Therapy: Patient Spontanous Breathing  Post-op Pain: mild  Post-op Assessment: Post-op Vital signs reviewed, Patient's Cardiovascular Status Stable, Respiratory Function Stable, Patent Airway, No signs of Nausea or vomiting and Pain level controlled  Post-op Vital Signs: Reviewed and stable  Complications: No apparent anesthesia complications

## 2012-04-03 NOTE — Anesthesia Preprocedure Evaluation (Addendum)
Anesthesia Evaluation  Patient identified by MRN, date of birth, ID band Patient awake    Reviewed: Allergy & Precautions, H&P , NPO status , Patient's Chart, lab work & pertinent test results  History of Anesthesia Complications (+) PONV  Airway Mallampati: II TM Distance: >3 FB Neck ROM: Full    Dental No notable dental hx. (+) Teeth Intact, Caps and Dental Advisory Given   Pulmonary former smoker (remote smoker) breath sounds clear to auscultation  Pulmonary exam normal       Cardiovascular hypertension, Pt. on medications Rhythm:Regular Rate:Normal     Neuro/Psych  Headaches, negative psych ROS   GI/Hepatic Neg liver ROS, GERD-  Medicated and Controlled,  Endo/Other  negative endocrine ROSDiabetes mellitus-  Renal/GU negative Renal ROS     Musculoskeletal   Abdominal (+) + obese,   Peds  Hematology negative hematology ROS (+)   Anesthesia Other Findings   Reproductive/Obstetrics preg test Neg                           Anesthesia Physical Anesthesia Plan  ASA: II  Anesthesia Plan: General   Post-op Pain Management:    Induction: Intravenous  Airway Management Planned: Oral ETT and Video Laryngoscope Planned  Additional Equipment:   Intra-op Plan:   Post-operative Plan: Extubation in OR  Informed Consent: I have reviewed the patients History and Physical, chart, labs and discussed the procedure including the risks, benefits and alternatives for the proposed anesthesia with the patient or authorized representative who has indicated his/her understanding and acceptance.   Dental advisory given  Plan Discussed with: CRNA, Surgeon and Anesthesiologist  Anesthesia Plan Comments: (Plan routine monitors, GETA with VideoGlide intubation  )       Anesthesia Quick Evaluation

## 2012-04-03 NOTE — Op Note (Signed)
Tanya Mcdaniel, BACCHI NO.:  [id number]  MEDICAL RECORD NO.:  [id number]  LOCATION:  [facility]                         FACILITY:  [facility]  PHYSICIAN:  Estill Bamberg, MD      DATE OF BIRTH:  23-Jul-1961  DATE OF PROCEDURE:  04/03/2012 DATE OF DISCHARGE:                              OPERATIVE REPORT   PREOPERATIVE DIAGNOSIS:  Cervical myeloradiculopathy.  POSTOPERATIVE DIAGNOSIS:  Cervical myeloradiculopathy.  PROCEDURES: 1. C4-5, C5-6 anterior cervical decompression and fusion. 2. Placement of anterior instrumentation, C4, C5, C6. 3. Placement of interbody device x2 (small 6-mm Titan interbody cage). 4. Use of local autograft. 5. Use of morselized allograft. 6. Intraoperative use of fluoroscopy.  SURGEON:  Estill Bamberg, MD  ASSISTANT:  None.  ANESTHESIA:  General endotracheal anesthesia.  COMPLICATIONS:  None.  DISPOSITION:  Stable.  ESTIMATED BLOOD LOSS:  Minimal.  INDICATION FOR PROCEDURE:  Briefly, Ms. Tanya Mcdaniel is a very pleasant 51- year-old female who presented to me on Mar 08, 2012, with severe and profound deterioration of her balance.  She also had pain involving both the right and left arms.  An EMG was negative; however, an MRI was clearly notable for spinal cord compression and nerve compression associated with the C4-5 and C5-6 levels.  Of note, the patient did have bilateral carpal tunnel releases to help address her symptoms, but she did not get any improvement from this at all.  The patient was very clear and stating that her symptoms have been progressive over time and that she clearly has been having ongoing progressive deterioration in her balance and fine motor skills.  We therefore did have a discussion regarding going forward with the C4-5 and C5-6 ACDF.  The patient clearly understood the risks and limitations of the procedure.  Of note, the patient is a TEFL teacher Witness and did state prior to her surgery that with rather dye, then  received autologous blood.  She did, however, consent to allograft bone.  Also of particular note, the patient did clearly understand that the goal of surgery was to prevent additional deterioration of her myelopathy and not to reverse the presence of it.  OPERATIVE DETAILS:  On April 03, 2012, the patient was brought to the Surgery and general endotracheal anesthesia was administered.  The patient was placed supine on a well-padded hospital bed.  The neck was placed in a gentle degree of extension.  All bony prominences were meticulously padded.  The ulnar nerves were protected.  The arms were secured to the patient's sides.  The neck was then prepped and draped in usual sterile fashion.  Prior to draping the neck, I did bring in lateral fluoroscopy to help optimize the location of the incision.  The neck was then draped and a time-out procedure was performed. Antibiotics were given.  I then made a transverse incision from the midline to the medial border of the sternocleidomastoid muscle.  The platysma was sharply incised.  The plane between the sternocleidomastoid muscle laterally and the strap muscles medially was identified and explored.  The anterior cervical spine was readily noted.  I did obtain a lateral fluoroscopic view to confirm the appropriate operative level. Of note,  there were abandoned and significant osteophytes noted anteriorly at the C4-5 and C5-6 levels.  These were removed with a rongeur.  The osteophytes were placed in the back table for later use. I then turned my attention towards the C5-6 interspace.  The C4, C5 and C6 vertebral bodies were subperiosteally exposed and a self-retaining Shadow Line retractor was placed, centered over the C5-6 interspace.  I then used a 15-blade knife to perform an annulotomy and a diskectomy was performed in an usual fashion.  The posterior longitudinal ligament was readily identified and entered using a nerve hook.  I then used  #1 followed by #2 Kerrison to thoroughly remove the posterior longitudinal ligament and the posterior anulus.  A neuroforaminal decompression was performed on both the right and the left sides and I did use a nerve hook to confirm complete decompression of the neuroforamina.  I then prepared the endplates in anticipation for an interbody graft.  Of note, the endplates were very much irregular and I did even out the endplates using a high-speed bur.  I then placed a series of trials and I did feel that a 6-mm small trial would be the most appropriate fit.  A parallel 6- mm trial was packed with DBX mix and autograft obtained from removing the osteophytes.  This was tamped into position in the usual fashion.  I was very pleased with the final press fit.  I then turned my attention towards the C4-5 interspace.  Again, diskectomy was performed in the manner described previously.  I again was able to remove the posterior longitudinal ligament and the posterior anulus and a thorough neuroforaminal decompression was performed on both the right and the left sides.  Again, a 6-mm small Titan interbody graft was sealed with autograft and allograft and tamped into position.  I was again very pleased with the final press-fit.  I then chose an appropriately-sized Vectra plate, which was placed over the anterior cervical spine.  Self- drilling and self-tapping screws were placed, two in each vertebral body at C4, C5 and C6.  I was very happy with the final purchase of each of the screws.  At this point, I did obtain a lateral fluoroscopic view and I was very pleased with the appearance of the screws and the interbody grafts.  I then explored the wound for any undue bleeding.  There were small bleeding vessels, which were coagulated using bipolar electrocautery.  I then evaluated the soft tissues for any undue injury and there was none.  I then copiously irrigated the wound.  I closed the platysma using  2-0 Vicryl and the skin was closed using 4-0 Monocryl. Benzoin and Steri-Strips were applied, as was a sterile dressing.  The patient was then awakened from general endotracheal anesthesia and transferred to recovery in stable condition.     Estill Bamberg, MD     MD/MEDQ  D:  04/03/2012  T:  04/03/2012  Job:  [id number]  cc:   Dr. Romilda Garret

## 2012-04-04 NOTE — Plan of Care (Signed)
Problem: Consults Goal: Diagnosis - Spinal Surgery Outcome: Completed/Met Date Met:  04/04/12 Cervical Spine Fusion     

## 2012-04-04 NOTE — Op Note (Signed)
NAMELENNETTE, FADER NO.:  [id number]  MEDICAL RECORD NO.:  [id number]  LOCATION:  3536                         FACILITY:  [facility]  PHYSICIAN:  Estill Bamberg, MD      DATE OF BIRTH:  1961/09/03  DATE OF PROCEDURE: DATE OF DISCHARGE:  04/04/2012                              OPERATIVE REPORT   ADMISSION DIAGNOSIS:  Cervical myeloradiculopathy.  ADMISSION HISTORY:  Briefly, Ms. Espericueta is a pleasant 51 year old female who presented to me with severe deterioration in her balance.  I did review an MRI which was notable for varying degrees of both spinal cord compression and nerve compression.  The patient was ultimately admitted on April 03, 2012, for an ACDF above at the C4-5 and C5-6 levels.  HOSPITAL COURSE:  On April 03, 2012, the patient underwent the procedure reflected above.  The patient tolerated the procedure well and was transferred to recovery in stable condition.  The patient was evaluated by me on the morning of postoperative day #1.  She was neurovascularly intact.  She was tolerating p.o. well.  She was uneventfully discharged home.  DISCHARGE INSTRUCTIONS:  The patient will take Percocet for pain and Valium for spasms.  She will wear her Aspen cervical collar at all times.  She was given a [place] collar to be used for showering. She will follow up with me in approximately 2 weeks after her procedure.     Estill Bamberg, MD     MD/MEDQ  D:  04/04/2012  T:  04/04/2012  Job:  [id number]

## 2012-04-04 NOTE — Progress Notes (Signed)
Patient reports neck discomfort. Has been tolerating PO.  Has been ambulating.  BP 137/72  Pulse 76  Temp 97.3 F (36.3 C) (Oral)  Resp 18  SpO2 97%  NVI Collar appropriately applied Dressing CDI  POD #1 after C4-6 acdf  - d/c home today with philly collar for showering - f/u 2 weeks - d/c dressing in 4 days

## 2012-04-05 ENCOUNTER — Encounter (HOSPITAL_COMMUNITY): Payer: Self-pay | Admitting: Orthopedic Surgery

## 2012-04-14 ENCOUNTER — Observation Stay (HOSPITAL_COMMUNITY)
Admission: EM | Admit: 2012-04-14 | Discharge: 2012-04-14 | Disposition: A | Discharge status: Home / Self Care | Payer: Medicare Other | Attending: Emergency Medicine | Admitting: Emergency Medicine

## 2012-04-14 ENCOUNTER — Encounter (HOSPITAL_COMMUNITY): Payer: Self-pay | Admitting: Emergency Medicine

## 2012-04-14 ENCOUNTER — Emergency Department (HOSPITAL_COMMUNITY): Payer: Medicare Other

## 2012-04-14 DIAGNOSIS — T7840XA Allergy, unspecified, initial encounter: Principal | ICD-10-CM | POA: Insufficient documentation

## 2012-04-14 DIAGNOSIS — R221 Localized swelling, mass and lump, neck: Secondary | ICD-10-CM | POA: Insufficient documentation

## 2012-04-14 DIAGNOSIS — I10 Essential (primary) hypertension: Secondary | ICD-10-CM | POA: Insufficient documentation

## 2012-04-14 DIAGNOSIS — R220 Localized swelling, mass and lump, head: Secondary | ICD-10-CM | POA: Insufficient documentation

## 2012-04-14 DIAGNOSIS — Y92009 Unspecified place in unspecified non-institutional (private) residence as the place of occurrence of the external cause: Secondary | ICD-10-CM | POA: Insufficient documentation

## 2012-04-14 LAB — BASIC METABOLIC PANEL
BUN: 6 mg/dL (ref 6–23)
Calcium: 10.4 mg/dL (ref 8.4–10.5)
Chloride: 94 mEq/L — ABNORMAL LOW (ref 96–112)
GFR calc non Af Amer: >90 mL/min (ref >90)
Potassium: 3.1 mEq/L — ABNORMAL LOW (ref 3.5–5.1)

## 2012-04-14 LAB — CBC WITH DIFFERENTIAL/PLATELET
Basophils Absolute: 0.1 10*3/uL (ref 0.0–0.1)
Basophils Relative: 1 % (ref 0–1)
MCHC: 34.2 g/dL (ref 30.0–36.0)
Neutro Abs: 4.2 10*3/uL (ref 1.7–7.7)
Neutrophils Relative %: 50 % (ref 43–77)
RDW: 12.7 % (ref 11.5–15.5)

## 2012-04-14 MED ORDER — SODIUM CHLORIDE 0.9 % IV BOLUS (SEPSIS)
1000.0000 mL | Freq: Once | INTRAVENOUS | Status: AC
  Administered 2012-04-14: 1000 mL via INTRAVENOUS

## 2012-04-14 MED ORDER — IOHEXOL 300 MG/ML  SOLN
80.0000 mL | Freq: Once | INTRAMUSCULAR | Status: AC | PRN
  Administered 2012-04-14: 80 mL via INTRAVENOUS

## 2012-04-14 MED ORDER — DEXAMETHASONE SODIUM PHOSPHATE 10 MG/ML IJ SOLN
INTRAMUSCULAR | Status: AC
  Filled 2012-04-14: qty 1

## 2012-04-14 MED ORDER — FAMOTIDINE 20 MG PO TABS: 20.0000 mg | ORAL_TABLET | Freq: Two times a day (BID) | ORAL | Status: DC

## 2012-04-14 MED ORDER — PREDNISONE 20 MG PO TABS: 60.0000 mg | ORAL_TABLET | Freq: Every day | ORAL | Status: AC

## 2012-04-14 MED ORDER — DIAZEPAM 5 MG PO TABS
5.0000 mg | ORAL_TABLET | Freq: Once | ORAL | Status: AC
  Administered 2012-04-14: 5 mg via ORAL
  Filled 2012-04-14: qty 1

## 2012-04-14 MED ORDER — DIPHENHYDRAMINE HCL 50 MG/ML IJ SOLN
25.0000 mg | Freq: Once | INTRAMUSCULAR | Status: AC
  Administered 2012-04-14: 50 mg via INTRAVENOUS

## 2012-04-14 MED ORDER — DIPHENHYDRAMINE HCL 50 MG/ML IJ SOLN
INTRAMUSCULAR | Status: AC
  Filled 2012-04-14: qty 1

## 2012-04-14 MED ORDER — DEXAMETHASONE SODIUM PHOSPHATE 10 MG/ML IJ SOLN
10.0000 mg | Freq: Once | INTRAMUSCULAR | Status: AC
  Administered 2012-04-14: 10 mg via INTRAVENOUS

## 2012-04-14 MED ORDER — DIPHENHYDRAMINE HCL 50 MG/ML IJ SOLN
25.0000 mg | Freq: Once | INTRAMUSCULAR | Status: AC
Start: 2012-04-14 — End: 2012-04-14
  Administered 2012-04-14: 25 mg via INTRAVENOUS
  Filled 2012-04-14: qty 1

## 2012-04-14 MED ORDER — DIPHENHYDRAMINE HCL 25 MG PO TABS: 25.0000 mg | ORAL_TABLET | Freq: Four times a day (QID) | ORAL | Status: DC

## 2012-04-14 MED ORDER — MORPHINE SULFATE 4 MG/ML IJ SOLN
4.0000 mg | Freq: Once | INTRAMUSCULAR | Status: AC
  Administered 2012-04-14: 4 mg via INTRAVENOUS
  Filled 2012-04-14: qty 1

## 2012-04-14 MED ORDER — FAMOTIDINE IN NACL 20-0.9 MG/50ML-% IV SOLN
20.0000 mg | Freq: Once | INTRAVENOUS | Status: AC
  Administered 2012-04-14: 20 mg via INTRAVENOUS

## 2012-04-14 MED ORDER — FAMOTIDINE IN NACL 20-0.9 MG/50ML-% IV SOLN
INTRAVENOUS | Status: AC
  Filled 2012-04-14: qty 50

## 2012-04-14 NOTE — ED Notes (Signed)
Patient transported to CT 

## 2012-04-14 NOTE — ED Notes (Addendum)
Pt reports noticed (L) upper lip swelling starting approx 1300 today, swelling became progressively worse, pt then reported swelling and tightness to anterior throat and mouth, pt denies sob, changing medications or using new products of any kind. Pt presents w/a c-collar in place d/t a C spine fusion on June 19 th. Pt denies any complications post surgical. Airway intact, O2 sats 100% on ra, nad

## 2012-04-14 NOTE — ED Notes (Signed)
Pt reports facial swelling onset a couple of hours ago. Pt denies shortness of breath. Pt had neck surgery on the 19th of this month. Pt denies use of new products or injury.

## 2012-04-14 NOTE — ED Notes (Addendum)
MD at bedside. EDP Hyman Hopes and Dr. Luiz Blare

## 2012-04-14 NOTE — ED Provider Notes (Signed)
History     CSN: [id number]  Arrival date & time 04/14/12  1508   First MD Initiated Contact with Patient 04/14/12 1603      Chief Complaint  Patient presents with  . Facial Swelling    (Consider location/radiation/quality/duration/timing/severity/associated sxs/prior treatment) HPI  50yoF history of recent anterior cervical decompression, discectomy, fusion on 04/03/2012 presents with facial swelling. The patient states proximally 2 hours prior to arrival she began to feel left upper lip tingling. She states this progressed to swelling. The swelling extends to her left face with minimal swelling to her right face as well. She states that her neck feels that it "getting tight". She denies no acute bleeding, denies throat swelling. She denies increased pain from her baseline postsurgical pain. SHe denies numbness, tingling, weakness of her extremities. She denies new exposures. There no new medications. She is not taking ACE inhibitors. She does not have a known family history of similar. Denies tooth pain. Denies fevers, chills. No history of similar.   ED Notes, ED Provider Notes from 04/14/12 0000 to 04/14/12 15:23:40       Keshia Tommy Rainwater, RN 04/14/2012 15:20      Pt reports facial swelling onset a couple of hours ago. Pt denies shortness of breath. Pt had neck surgery on the 19th of this month. Pt denies use of new products or injury.     Past Medical History  Diagnosis Date  . Hypertension   . Crohn's disease   . GERD (gastroesophageal reflux disease)   . Anemia   . Hypercholesterolemia   . Cervical herniated disc   . Crohn's disease   . Urticaria 01/2011  . PONV (postoperative nausea and vomiting)   . Family history of anesthesia complication     mother difficulty waking up  . Headache   . Arthritis   . Diabetes mellitus     borderline    Past Surgical History  Procedure Date  . Carpal tunnel release 2011  . Cholecystectomy   . Appendectomy   . Hernia repair     . Tubal ligation   . Bladder sling   . Knee arthroscopy     Left  . Anterior cervical decomp/discectomy fusion 04/03/2012    Procedure: ANTERIOR CERVICAL DECOMPRESSION/DISCECTOMY FUSION 2 LEVELS;  Surgeon: Emilee Hero, MD;  Location: [facility] OR;  Service: Orthopedics;  Laterality: Bilateral;  Anterior cervical decompression fusion, cervical 4-5, cervical 5-6 with instrumentation and allograft.  . Breast lumpectomy   . Ovary surgery for 2 tumors    Family History  Problem Relation Age of Onset  . Hypertension Other   . Stroke Other   . Heart attack Other     History  Substance Use Topics  . Smoking status: Former Smoker    Types: Cigarettes    Quit date: 03/02/2002  . Smokeless tobacco: Not on file  . Alcohol Use: 3.0 oz/week    5 Glasses of wine per week    OB History    Grav Para Term Preterm Abortions TAB SAB Ect Mult Living                  Review of Systems  All other systems reviewed and are negative.   except as noted HPI   Allergies  Sulfa antibiotics  Home Medications   Current Outpatient Rx  Name Route Sig Dispense Refill  . DIAZEPAM 5 MG PO TABS Oral Take 1-2 mg by mouth every 5 (five) hours as needed. For spasms    .  ESOMEPRAZOLE MAGNESIUM 40 MG PO CPDR Oral Take 40 mg by mouth daily before breakfast.    . EZETIMIBE-SIMVASTATIN 10-20 MG PO TABS Oral Take 1 tablet by mouth at bedtime.    . OXYCODONE-ACETAMINOPHEN 5-325 MG PO TABS Oral Take 1 tablet by mouth every 8 (eight) hours as needed. For pain    . PROMETHAZINE HCL 25 MG PO TABS Oral Take 25 mg by mouth every 6 (six) hours as needed. For nausea/vomiting    . VALSARTAN-HYDROCHLOROTHIAZIDE 160-25 MG PO TABS Oral Take 1 tablet by mouth daily.    Marland Kitchen DIPHENHYDRAMINE HCL 25 MG PO TABS Oral Take 1 tablet (25 mg total) by mouth every 6 (six) hours. 20 tablet 0    Take every 6 hours for three days then as needed  . FAMOTIDINE 20 MG PO TABS Oral Take 1 tablet (20 mg total) by mouth 2 (two) times daily.  10 tablet 0    Take twice daily for the next 3 days then as neede ...    BP 133/80  Pulse 103  Temp 98.9 F (37.2 C) (Oral)  Resp 18  SpO2 97%  LMP 04/04/2012  Physical Exam  Nursing note and vitals reviewed. Constitutional: She is oriented to person, place, and time. She appears well-developed.  HENT:  Head: Atraumatic.  Mouth/Throat: Oropharynx is clear and moist.       Left upper lip and left facial swelling erythema. No tenderness to palpation. She has no tenderness to percussion of her teeth and no appreciable abscess.  Eyes: Conjunctivae and EOM are normal. Pupils are equal, round, and reactive to light.  Neck: Normal range of motion. Neck supple.       Anterior neck with surgical wound. Steri-Strips place. Wound clean dry and intact without surrounding erythema or tenderness to palpation. No appreciable anterior neck swelling. No appreciable lymphadenopathy. No Stridor  Cardiovascular: Normal rate, regular rhythm, normal heart sounds and intact distal pulses.   Pulmonary/Chest: Effort normal and breath sounds normal. No respiratory distress. She has no wheezes. She has no rales.       No wheezing.  Abdominal: Soft. She exhibits no distension. There is no tenderness. There is no rebound and no guarding.  Musculoskeletal: Normal range of motion.  Neurological: She is alert and oriented to person, place, and time.  Skin: Skin is warm and dry. No rash noted.  Psychiatric: She has a normal mood and affect.    ED Course  Procedures (including critical care time)  Labs Reviewed  CBC WITH DIFFERENTIAL - Abnormal; Notable for the following:    Platelets 595 (*)     All other components within normal limits  BASIC METABOLIC PANEL - Abnormal; Notable for the following:    Potassium 3.1 (*)     Chloride 94 (*)     All other components within normal limits   Ct Soft Tissue Neck W Contrast  04/14/2012  *RADIOLOGY REPORT*  Clinical Data: Facial swelling, neck swelling.  Recent  neck surgery.  CT NECK WITH CONTRAST  Technique:  Multidetector CT imaging of the neck was performed with intravenous contrast.  Contrast: 80mL OMNIPAQUE IOHEXOL 300 MG/ML  SOLN  Comparison: Intraoperative images from prior ACDF performed 04/03/2012  Findings: There is slight stranding noted in the prevertebral soft tissues overlying the surgical site in the lower cervical spine, likely related to recent surgery.  I see no large or significant prevertebral or epidural hematoma.  Airway is patent.  No cervical or upper mediastinal adenopathy.  Lung  apices are clear.  Visualized paranasal sinuses and orbital soft tissues unremarkable.  Vascular structures and thyroid are unremarkable.  IMPRESSION: Slight soft tissue prominence and stranding anterior to the C4-C6 ACDF, expected for recent postoperative state.  No large or significant prevertebral or epidural hematoma.  No hardware or bony complicating feature.  Original Report Authenticated By: Cyndie Chime, M.D.    1. Allergic reaction     MDM  She appears to have an allergic reaction unknown cause. Her airway is intact. CT soft tissue neck without complication from recent surgery. Dr. Marshell Levan partner has been in the emergency department and evaluated the patient. He also feels that she is cleared from that standpoint. She did have one brief episode of swelling in the emergency department he was given an additional dose of 25 mg Benadryl. Her airway continues to remain intact. She is approximately 8 hours post initial swelling and stable. She will be discharged home with Pepcid, Benadryl, prednisone from CDU.        Forbes Cellar, MD 04/14/12 2126

## 2012-04-14 NOTE — ED Provider Notes (Signed)
She arrived to CDU asymptomatic and remained that way. She reports she is ready for discharge home. Discussed medications for home and to return if symptoms recur.  Rodena Medin, PA-C 04/14/12 2115

## 2012-04-14 NOTE — ED Notes (Signed)
Pt is waiting in lobby, reports increase in tightness sensation to neck, spo2 is 99% and airway is intact. Informed pt that we will be getting her back to room asap. No resp distress noted.

## 2012-04-14 NOTE — Discharge Instructions (Signed)
FOLLOW UP WITH YOUR DOCTOR FOR RECHECK IN 1-2 DAYS. TAKE BENADRYL AND PEPCID AS DIRECTED AND RETURN HERE AS NEEDED FOR ANY CONCERNING SYMPTOMS.   Allergic Reaction, Mild to Moderate Allergies may happen from anything your body is sensitive to. This may be food, medications, pollens, chemicals, and nearly anything around you in everyday life that produces allergens. An allergen is anything that causes an allergy producing substance. Allergens cause your body to release allergic antibodies. Through a chain of events, they cause a release of histamine into the blood stream. Histamines are meant to protect you, but they also cause your discomfort. This is why antihistamines are often used for allergies. Heredity is often a factor in causing allergic reactions. This means you may have some of the same allergies as your parents. Allergies happen in all age groups. You may have some idea of what caused your reaction. There are many allergens around [place]. It may be difficult to know what caused your reaction. If this is a first time event, it may never happen again. Allergies cannot be cured but can be controlled with medications. SYMPTOMS  You may get some or all of the following problems from allergies.  Swelling and itching in and around the mouth.   Tearing, itchy eyes.   Nasal congestion and runny nose.   Sneezing and coughing.   An itchy red rash or hives.   Vomiting or diarrhea.   Difficulty breathing.  Seasonal allergies occur in all age groups. They are seasonal because they usually occur during the same season every year. They may be a reaction to molds, grass pollens, or tree pollens. Other causes of allergies are house dust mite allergens, pet dander and mold spores. These are just a common few of the thousands of allergens around [place]. All of the symptoms listed above happen when you come in contact with pollens and other allergens. Seasonal allergies are usually not life threatening. They are  generally more of a nuisance that can often be handled using medications. Hay fever is a combination of all or some of the above listed allergy problems. It may often be treated with simple over-the-counter medications such as diphenhydramine. Take medication as directed. Check with your caregiver or package insert for child dosages. TREATMENT AND HOME CARE INSTRUCTIONS If hives or rash are present:  Take medications as directed.   You may use an over-the-counter antihistamine (diphenhydramine) for hives and itching as needed. Do not drive or drink alcohol until medications used to treat the reaction have worn off. Antihistamines tend to make people sleepy.   Apply cold cloths (compresses) to the skin or take baths in cool water. This will help itching. Avoid hot baths or showers. Heat will make a rash and itching worse.   If your allergies persist and become more severe, and over the counter medications are not effective, there are many new medications your caretaker can prescribe. Immunotherapy or desensitizing injections can be used if all else fails. Follow up with your caregiver if problems continue.  SEEK MEDICAL CARE IF:   Your allergies are becoming progressively more troublesome.   You suspect a food allergy. Symptoms generally happen within 30 minutes of eating a food.   Your symptoms have not gone away within 2 days or are getting worse.   You develop new symptoms.   You want to retest yourself or your child with a food or drink you think causes an allergic reaction. Never test yourself or your child of a suspected  allergy without being under the watchful eye of your caregivers. A second exposure to an allergen may be life-threatening.  SEEK IMMEDIATE MEDICAL CARE IF:  You develop difficulty breathing or wheezing, or have a tight feeling in your chest or throat.   You develop a swollen mouth, hives, swelling, or itching all over your body.  A severe reaction with any of the  above problems should be considered life-threatening. If you suddenly develop difficulty breathing call for local emergency medical help. THIS IS AN EMERGENCY. MAKE SURE YOU:   Understand these instructions.   Will watch your condition.   Will get help right away if you are not doing well or get worse.  Document Released: 07/30/2007 Document Revised: 09/21/2011 Document Reviewed: 07/30/2007 [facility] Patient Information 2012 [place], [place].

## 2012-10-16 HISTORY — PX: HYSTEROSCOPY: SHX211

## 2013-01-22 IMAGING — CT CT NECK W/ CM
4 series · 16 of 33 positions shown, 19 images · IV contrast (APPLIED)
Comparison: Intraoperative images from prior ACDF performed
04/03/2012

CLINICAL DATA: Facial swelling, neck swelling.  Recent neck
surgery.

CT NECK WITH CONTRAST
TECHNIQUE: Multidetector CT imaging of the neck was performed with
intravenous contrast.
Contrast: 80mL OMNIPAQUE IOHEXOL 300 MG/ML  SOLN

[Series 3: st neck 2.0 b31s · axial · 0.51mm/px · z∈[-216,-148]mm · 3 of 101 slices shown]
[im 17/101  bone]
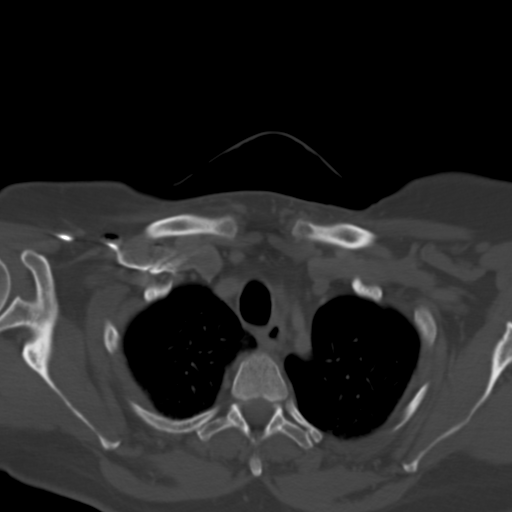
[im 34/101  bone]
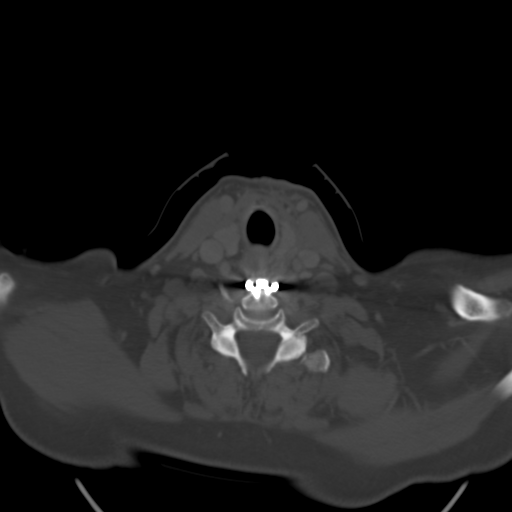
[im 51/101  bone]
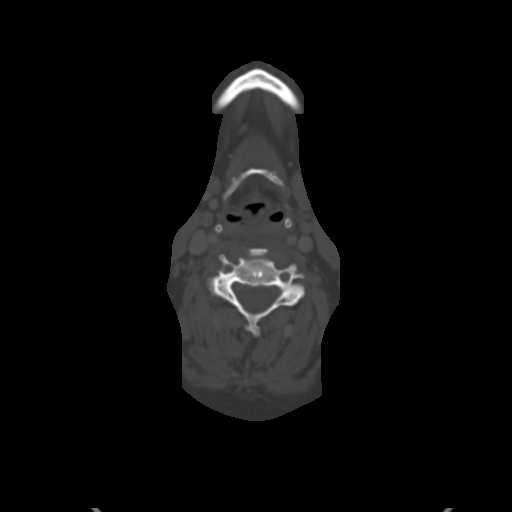

[Series 602: <mpr thick coronals · coronal · 0.51mm/px · 3 of 66 slices shown]
[im 22/66  bone]
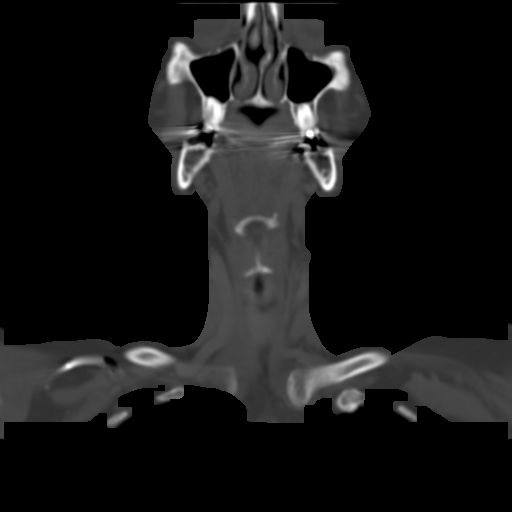
[im 29/66  bone]
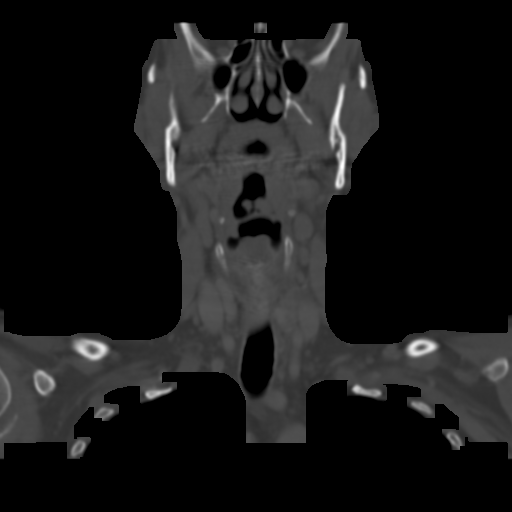
[im 37/66  bone]
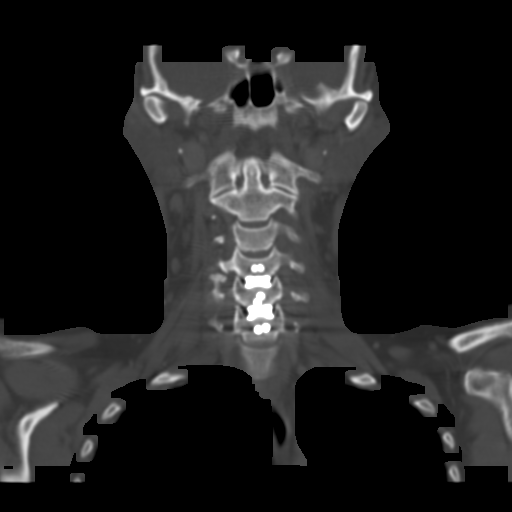

[Series 603: <mpr thick sagitals · sagittal · 0.51mm/px · 5 of 51 slices shown, 6 images]
[im 17/51  bone]
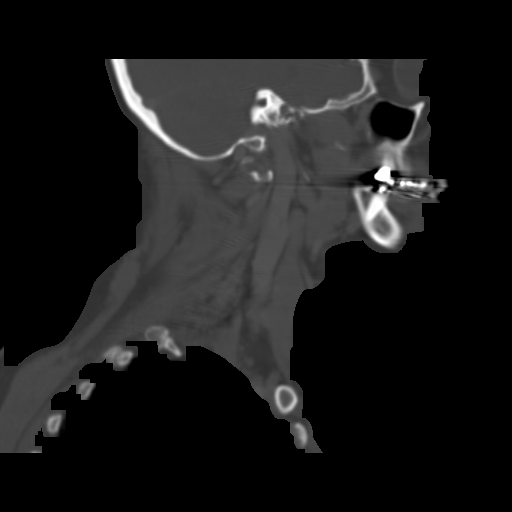
[im 21/51  bone]
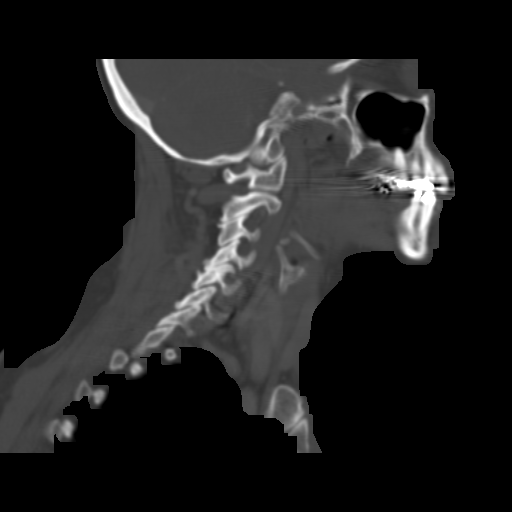
[im 26/51  soft-tissue]
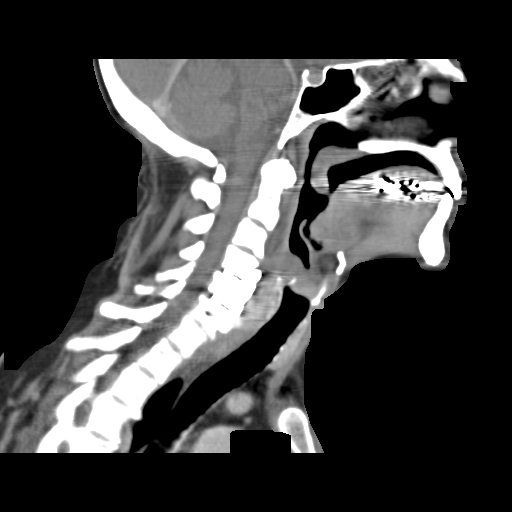
[im 26/51  bone]
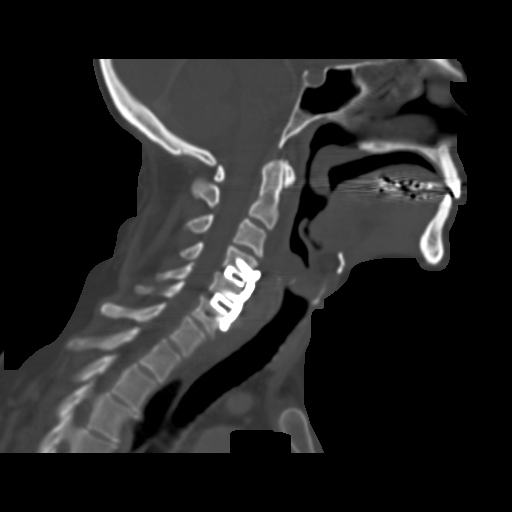
[im 30/51  bone]
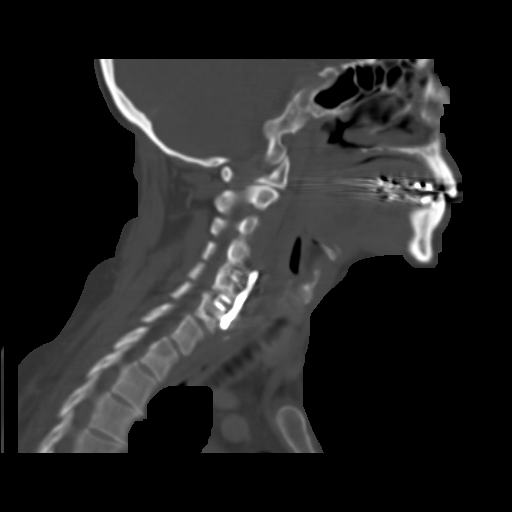
[im 34/51  bone]
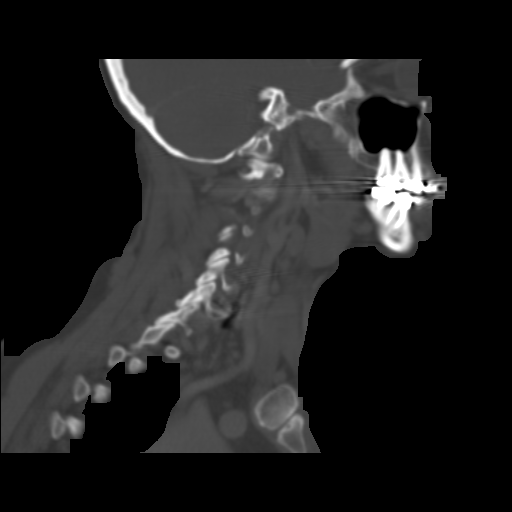

[Series 604: <mpr thick orthog · axial · 0.51mm/px · z∈[-173,-13]mm · 5 of 88 slices shown, 7 images]
[im 15/88  soft-tissue]
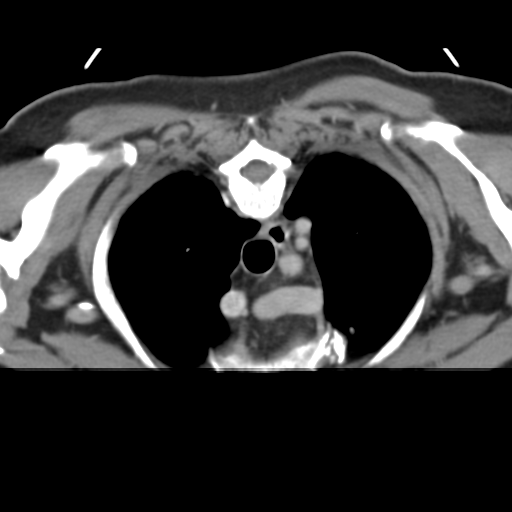
[im 15/88  bone]
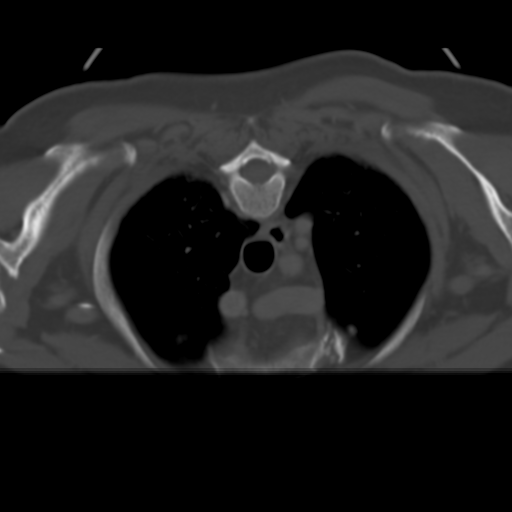
[im 30/88  bone]
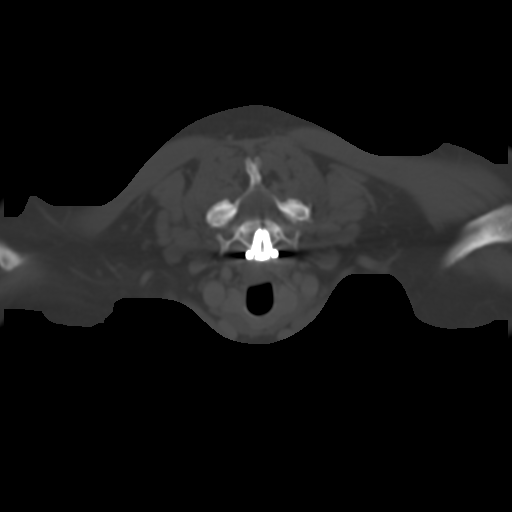
[im 44/88  bone]
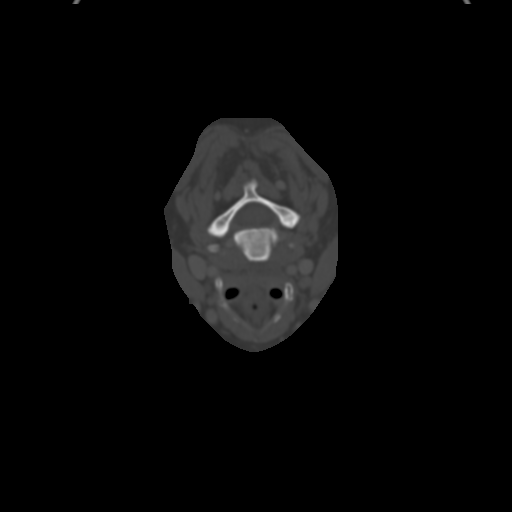
[im 59/88  bone]
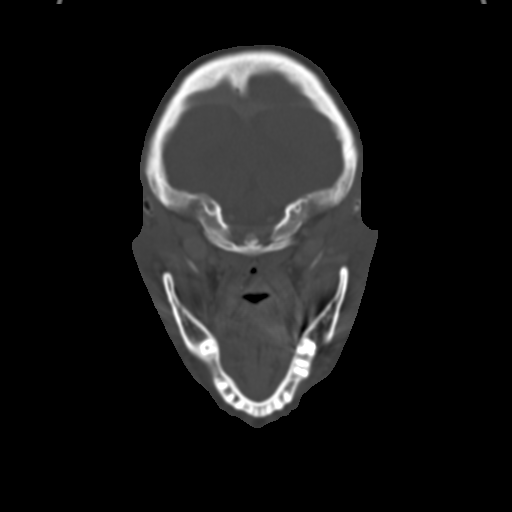
[im 73/88  soft-tissue]
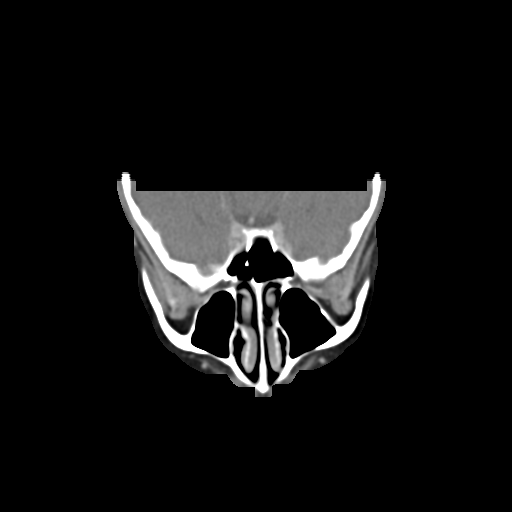
[im 73/88  bone]
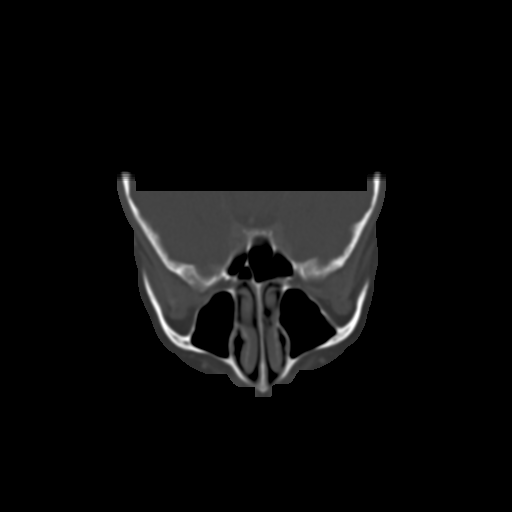

[16 of 33 positions shown; findings below may reference images not displayed]

FINDINGS: There is slight stranding noted in the prevertebral soft
tissues overlying the surgical site in the lower cervical spine,
likely related to recent surgery.  I see no large or significant
prevertebral or epidural hematoma.  Airway is patent.  No cervical
or upper mediastinal adenopathy.

Lung apices are clear.  Visualized paranasal sinuses and orbital
soft tissues unremarkable.  Vascular structures and thyroid are
unremarkable.
IMPRESSION: Slight soft tissue prominence and stranding anterior to the C4-C6
ACDF, expected for recent postoperative state.  No large or
significant prevertebral or epidural hematoma.  No hardware or bony
complicating feature.

## 2016-12-14 HISTORY — PX: TOOTH EXTRACTION: SUR596

## 2017-06-11 HISTORY — PX: ANTERIOR AND POSTERIOR VAGINAL REPAIR: SUR5

## 2018-11-25 ENCOUNTER — Other Ambulatory Visit: Payer: Self-pay | Admitting: Orthopedic Surgery

## 2018-11-25 NOTE — Pre-Procedure Instructions (Signed)
Tanya Mcdaniel  11/25/2018      [facility]ORE #[id number] G[place] Otto, [place] - [address] AT [facility] OF [address] & [facility] [address] [place] [address] Phone: [phone] Fax: [phone]  [facility] [facility] [address] - [place], [place] - [address] [facility] [address] [place] [address] Phone: [phone] Fax: [phone]    Your procedure is scheduled on Wednesday, Feb. 26th   Report to [facility] Admitting at 5:30 AM             (posted surgery time 7:30a - 10a)   Call this number if you have problems the morning of surgery:  [phone]   Remember:   Do not eat any foods or drink any liquids after midnight, Tuesday.   7 days prior to surgery, STOP TAKING any Vitamins, Herbal Supplements, Anti-inflammatories, Blood Thinners.    Take these medicines the morning of surgery with A SIP OF WATER : Diazepam, Esomeprazole, Oxycodone.    Do not wear jewelry, make-up or nail polish.  Do not wear lotions, powders, or perfumes, or deodorant.  Do not shave 48 hours prior to surgery.    Do not bring valuables to the hospital.  [facility] is not responsible for any belongings or valuables.  Contacts, dentures or bridgework may not be worn into surgery.  Leave your suitcase in the car.  After surgery it may be brought to your room.  For patients admitted to the hospital, discharge time will be determined by your treatment team.  Patients discharged the day of surgery will not be allowed to drive home.   Please read over the following fact sheets that you were given. Pain Booklet, Coughing and Deep Breathing, MRSA Information and Surgical Site Infection Prevention       [facility]- Preparing For Surgery  Before surgery, you can play an important role. Because skin is not sterile, your skin needs to be as free of germs as possible. You can reduce the number of germs on your skin by washing with CHG (chlorahexidine  gluconate) Soap before surgery.  CHG is an antiseptic cleaner which kills germs and bonds with the skin to continue killing germs even after washing.    Oral Hygiene is also important to reduce your risk of infection.    Remember - BRUSH YOUR TEETH THE MORNING OF SURGERY WITH YOUR REGULAR TOOTHPASTE  Please do not use if you have an allergy to CHG or antibacterial soaps. If your skin becomes reddened/irritated stop using the CHG.  Do not shave (including legs and underarms) for at least 48 hours prior to first CHG shower. It is OK to shave your face.  Please follow these instructions carefully.   1. Shower the NIGHT BEFORE SURGERY and the MORNING OF SURGERY with CHG.   2. If you chose to wash your hair, wash your hair first as usual with your normal shampoo.  3. After you shampoo, rinse your hair and body thoroughly to remove the shampoo.  4. Use CHG as you would any other liquid soap. You can apply CHG directly to the skin and wash gently with a scrungie or a clean washcloth.   5. Apply the CHG Soap to your body ONLY FROM THE NECK DOWN.  Do not use on open wounds or open sores. Avoid contact with your eyes, ears, mouth and genitals (private parts). Wash Face and genitals (private parts)  with your normal soap.  6. Wash thoroughly, paying special attention to the area where your surgery will be performed.  7. Thoroughly rinse your body with warm water from the neck down.  8. DO NOT shower/wash with your normal soap after using and rinsing off the CHG Soap.  9. Pat yourself dry with a CLEAN TOWEL.  10. Wear CLEAN PAJAMAS to bed the night before surgery, wear comfortable clothes the morning of surgery  11. Place CLEAN SHEETS on your bed the night of your first shower and DO NOT SLEEP WITH PETS.   Day of Surgery:  Do not apply any deodorants/lotions.  Please wear clean clothes to the hospital/surgery center.   Remember to brush your teeth WITH YOUR REGULAR TOOTHPASTE.

## 2018-11-26 ENCOUNTER — Inpatient Hospital Stay (HOSPITAL_COMMUNITY)
Admission: RE | Admit: 2018-11-26 | Discharge: 2018-11-26 | Disposition: A | Discharge status: Home / Self Care | Payer: Medicare Other | Source: Ambulatory Visit

## 2018-12-04 ENCOUNTER — Encounter (HOSPITAL_COMMUNITY): Payer: Self-pay

## 2018-12-04 NOTE — Pre-Procedure Instructions (Signed)
DOTTY BOATRIGHT  12/04/2018      [facility] #[id number] Ginette Otto, [place] - [address] AT [facility] OF [address] & [facility] [address] [place] [address] Phone: [phone] Fax: [phone]  [facility] [address] - [place], [place] - [address] [facility] [address] [place] [address] Phone: [phone] Fax: [phone]  [facility]arin Golden at [facility] [place], [place] - [address] [place] [address] Phone: [phone] Fax: [phone]    Your procedure is scheduled on December 11, 2018.  Report to [facility] Admitting at 1050 AM.  Call this number if you have problems the morning of surgery:  [phone]   Remember:  Do not eat or drink after midnight.     Take these medicines the morning of surgery with A SIP OF WATER  Benadryl Levocetirizine (Xyzal) Esomeprazole (nexium) Gabapentin (neurontin) Oxycodone-acetaminophen (percocet)-if needed  Phenergan-if needed for nausea Eyedrops-if needed  7 days prior to surgery STOP taking any Aspirin (unless otherwise instructed by your surgeon), Aleve, Naproxen, Ibuprofen, Motrin, Advil, Goody's, BC's, all herbal medications, fish oil, and all vitamins    How to Manage Your Diabetes Before and After Surgery  Why is it important to control my blood sugar before and after surgery? . Improving blood sugar levels before and after surgery helps healing and can limit problems. . A way of improving blood sugar control is eating a healthy diet by: o  Eating less sugar and carbohydrates o  Increasing activity/exercise o  Talking with your doctor about reaching your blood sugar goals . High blood sugars (greater than 180 mg/dL) can raise your risk of infections and slow your recovery, so you will need to focus on controlling your diabetes during the weeks before surgery. . Make sure that the doctor who takes care of your diabetes knows about  your planned surgery including the date and location.  How do I manage my blood sugar before surgery? . Check your blood sugar at least 4 times a day, starting 2 days before surgery, to make sure that the level is not too high or low. o Check your blood sugar the morning of your surgery when you wake up and every 2 hours until you get to the Short Stay unit. . If your blood sugar is less than 70 mg/dL, you will need to treat for low blood sugar: o Do not take insulin. o Treat a low blood sugar (less than 70 mg/dL) with  cup of clear juice (cranberry or apple), 4 glucose tablets, OR glucose gel. Recheck blood sugar in 15 minutes after treatment (to make sure it is greater than 70 mg/dL). If your blood sugar is not greater than 70 mg/dL on recheck, call [phone] o  for further instructions. . Report your blood sugar to the short stay nurse when you get to Short Stay.  . If you are admitted to the hospital after surgery: o Your blood sugar will be checked by the staff and you will probably be given insulin after surgery (instead of oral diabetes medicines) to make sure you have good blood sugar levels. o The goal for blood sugar control after surgery is 80-180 mg/dL.   [facility]- Preparing For Surgery  Before surgery, you can play an important role. Because skin is not sterile, your skin needs to be as free of germs as possible. You can reduce the number of germs on  your skin by washing with CHG (chlorahexidine gluconate) Soap before surgery.  CHG is an antiseptic cleaner which kills germs and bonds with the skin to continue killing germs even after washing.    Oral Hygiene is also important to reduce your risk of infection.  Remember - BRUSH YOUR TEETH THE MORNING OF SURGERY WITH YOUR REGULAR TOOTHPASTE  Please do not use if you have an allergy to CHG or antibacterial soaps. If your skin becomes reddened/irritated stop using the CHG.  Do not shave (including legs and underarms) for at  least 48 hours prior to first CHG shower. It is OK to shave your face.  Please follow these instructions carefully.   1. Shower the NIGHT BEFORE SURGERY and the MORNING OF SURGERY with CHG.   2. If you chose to wash your hair, wash your hair first as usual with your normal shampoo.  3. After you shampoo, rinse your hair and body thoroughly to remove the shampoo.  4. Use CHG as you would any other liquid soap. You can apply CHG directly to the skin and wash gently with a scrungie or a clean washcloth.   5. Apply the CHG Soap to your body ONLY FROM THE NECK DOWN.  Do not use on open wounds or open sores. Avoid contact with your eyes, ears, mouth and genitals (private parts). Wash Face and genitals (private parts)  with your normal soap.  6. Wash thoroughly, paying special attention to the area where your surgery will be performed.  7. Thoroughly rinse your body with warm water from the neck down.  8. DO NOT shower/wash with your normal soap after using and rinsing off the CHG Soap.  9. Pat yourself dry with a CLEAN TOWEL.  10. Wear CLEAN PAJAMAS to bed the night before surgery, wear comfortable clothes the morning of surgery  11. Place CLEAN SHEETS on your bed the night of your first shower and DO NOT SLEEP WITH PETS.  Day of Surgery:  Do not apply any deodorants/lotions.  Please wear clean clothes to the hospital/surgery center.   Remember to brush your teeth WITH YOUR REGULAR TOOTHPASTE.    Do not wear jewelry, make-up or nail polish.  Do not wear lotions, powders, or perfumes, or deodorant.  Do not shave 48 hours prior to surgery.    Do not bring valuables to the hospital.  [facility] is not responsible for any belongings or valuables.  Contacts, dentures or bridgework may not be worn into surgery.  Leave your suitcase in the car.  After surgery it may be brought to your room.  For patients admitted to the hospital, discharge time will be determined by your treatment  team.  Patients discharged the day of surgery will not be allowed to drive home.    Please read over the following fact sheets that you were given.

## 2018-12-05 ENCOUNTER — Inpatient Hospital Stay (HOSPITAL_COMMUNITY)
Admission: RE | Admit: 2018-12-05 | Discharge: 2018-12-05 | Disposition: A | Discharge status: Home / Self Care | Payer: Medicare Other | Source: Ambulatory Visit

## 2018-12-10 ENCOUNTER — Encounter (HOSPITAL_COMMUNITY)
Admission: RE | Admit: 2018-12-10 | Discharge: 2018-12-10 | Disposition: A | Discharge status: Home / Self Care | Payer: BLUE CROSS/BLUE SHIELD | Source: Ambulatory Visit | Attending: Orthopedic Surgery | Admitting: Orthopedic Surgery

## 2018-12-10 ENCOUNTER — Encounter (HOSPITAL_COMMUNITY): Payer: Self-pay

## 2018-12-10 ENCOUNTER — Other Ambulatory Visit: Payer: Self-pay

## 2018-12-10 DIAGNOSIS — Z01818 Encounter for other preprocedural examination: Secondary | ICD-10-CM | POA: Diagnosis not present

## 2018-12-10 HISTORY — DX: Rh incompatibility reaction due to transfusion of blood or blood products, unspecified, initial encounter: T80.40XA

## 2018-12-10 HISTORY — DX: Edema, unspecified: R60.9

## 2018-12-10 HISTORY — DX: Carpal tunnel syndrome, unspecified upper limb: G56.00

## 2018-12-10 HISTORY — DX: Myoneural disorder, unspecified: G70.9

## 2018-12-10 LAB — URINALYSIS, ROUTINE W REFLEX MICROSCOPIC
Bilirubin Urine: NEGATIVE
Glucose, UA: NEGATIVE mg/dL
Hgb urine dipstick: NEGATIVE
Ketones, ur: NEGATIVE mg/dL
Leukocytes,Ua: NEGATIVE
Nitrite: NEGATIVE
Protein, ur: NEGATIVE mg/dL
Specific Gravity, Urine: 1.012 (ref 1.005–1.030)
pH: 6 (ref 5.0–8.0)

## 2018-12-10 LAB — PROTIME-INR
INR: 0.9 (ref 0.8–1.2)
Prothrombin Time: 12.1 seconds (ref 11.4–15.2)

## 2018-12-10 LAB — COMPREHENSIVE METABOLIC PANEL
ALK PHOS: 71 U/L (ref 38–126)
ALT: 27 U/L (ref 0–44)
AST: 23 U/L (ref 15–41)
Albumin: 4.3 g/dL (ref 3.5–5.0)
Anion gap: 12 (ref 5–15)
BUN: 13 mg/dL (ref 6–20)
CALCIUM: 9.9 mg/dL (ref 8.9–10.3)
CO2: 24 mmol/L (ref 22–32)
Chloride: 97 mmol/L — ABNORMAL LOW (ref 98–111)
Creatinine, Ser: 0.79 mg/dL (ref 0.44–1.00)
GFR calc Af Amer: >60 mL/min (ref >60)
GFR calc non Af Amer: >60 mL/min (ref >60)
Glucose, Bld: 104 mg/dL — ABNORMAL HIGH (ref 70–99)
Potassium: 3.6 mmol/L (ref 3.5–5.1)
Sodium: 133 mmol/L — ABNORMAL LOW (ref 135–145)
Total Bilirubin: 0.6 mg/dL (ref 0.3–1.2)
Total Protein: 7.3 g/dL (ref 6.5–8.1)

## 2018-12-10 LAB — CBC WITH DIFFERENTIAL/PLATELET
Abs Immature Granulocytes: 0.01 10*3/uL (ref 0.00–0.07)
Basophils Absolute: 0 10*3/uL (ref 0.0–0.1)
Basophils Relative: 1 %
Eosinophils Absolute: 0.1 10*3/uL (ref 0.0–0.5)
Eosinophils Relative: 1 %
HCT: 38.9 % (ref 36.0–46.0)
Hemoglobin: 12.8 g/dL (ref 12.0–15.0)
IMMATURE GRANULOCYTES: 0 %
LYMPHS PCT: 34 %
Lymphs Abs: 2.2 10*3/uL (ref 0.7–4.0)
MCH: 30.6 pg (ref 26.0–34.0)
MCHC: 32.9 g/dL (ref 30.0–36.0)
MCV: 93.1 fL (ref 80.0–100.0)
Monocytes Absolute: 0.7 10*3/uL (ref 0.1–1.0)
Monocytes Relative: 10 %
Neutro Abs: 3.5 10*3/uL (ref 1.7–7.7)
Neutrophils Relative %: 54 %
Platelets: 367 10*3/uL (ref 150–400)
RBC: 4.18 MIL/uL (ref 3.87–5.11)
RDW: 12.1 % (ref 11.5–15.5)
WBC: 6.5 10*3/uL (ref 4.0–10.5)
nRBC: 0 % (ref 0.0–0.2)

## 2018-12-10 LAB — SURGICAL PCR SCREEN
MRSA, PCR: NEGATIVE
Staphylococcus aureus: POSITIVE — AB

## 2018-12-10 LAB — HEMOGLOBIN A1C
Hgb A1c MFr Bld: 5.9 % — ABNORMAL HIGH (ref 4.8–5.6)
Mean Plasma Glucose: 122.63 mg/dL

## 2018-12-10 LAB — APTT: aPTT: 27 seconds (ref 24–36)

## 2018-12-10 LAB — NO BLOOD PRODUCTS

## 2018-12-10 NOTE — Progress Notes (Signed)
Anesthesia Chart Review:  Case:  [id number] Date/Time:  12/11/18 1129   Procedure:  ANTERIOR CERVICAL DECOMPRESSION FUSION , CERVICAL 6-7 WITH INSTRUMENTATION AND ALLOGRAFT (N/A )   Anesthesia type:  General   Pre-op diagnosis:  RIGHT C7 RADICULOPATHY SECONDARY TO ADJACENT SEGMENT DISEASE AT C6-C7, THE LEVEL BELOW HER PREVIOUS FUSION.  AN ANTEROLISTHESES IS ALSO NOTED AT THE C6-C7 LEVEL.   [facility]  [facility] ROOM 21 / [facility] OR   Surgeon:  Estill Bamberg, MD      DISCUSSION: Patient is a 58 year old female scheduled for the above procedure.  History includes former smoker (quit '03), post-operative N/V, HTN, GERD, Crohn's disease, anemia, hypercholesterolemia, DM (borderline), peripheral edema, C4-6 ACDF 04/03/12. She is a TEFL teacher Witness, so refuses blood products. (Preoperative CBC WNL.)  If no acute changes then I would anticipate that she can proceed as planned.   VS: BP 133/81   Pulse 93   Temp 36.8 C   Resp 20   Ht 5\' 2"  (1.575 m)   Wt 69.4 kg   LMP 10/09/2017 (Approximate)   SpO2 98%   BMI 27.98 kg/m   PROVIDERS: Rolley Sims, MD is listed as PCP Teodoro Kil, [place])   LABS: Labs reviewed: Acceptable for surgery. (all labs ordered are listed, but only abnormal results are displayed)  Labs Reviewed  COMPREHENSIVE METABOLIC PANEL - Abnormal; Notable for the following components:      Result Value   Sodium 133 (*)    Chloride 97 (*)    Glucose, Bld 104 (*)    All other components within normal limits  HEMOGLOBIN A1C - Abnormal; Notable for the following components:   Hgb A1c MFr Bld 5.9 (*)    All other components within normal limits  SURGICAL PCR SCREEN  APTT  CBC WITH DIFFERENTIAL/PLATELET  PROTIME-INR  URINALYSIS, ROUTINE W REFLEX MICROSCOPIC    EKG: 12/10/18: NSR   CV: Nuclear stress test 06/05/13 [facility] Care Everywhere): IMPRESSIONS & RECOMMENDATIONS Normal exercise cardiolite (no electrocardiographic or nuclear evidence of ischemia) Normal left ventricular systolic  function; EF>55%  Past Medical History:  Diagnosis Date  . Anemia   . Arthritis   . Carpal tunnel syndrome   . Cervical herniated disc   . Crohn's disease (HCC)   . Crohn's disease (HCC)   . Diabetes mellitus    borderline  . Family history of anesthesia complication    mother difficulty waking up  . GERD (gastroesophageal reflux disease)   . Headache(784.0)   . Hypercholesterolemia   . Hypertension   . Neuromuscular disorder (HCC)   . Peripheral edema   . PONV (postoperative nausea and vomiting)   . Rh incompatibility 1981  . Urticaria 01/2011    Past Surgical History:  Procedure Laterality Date  . ANTERIOR AND POSTERIOR VAGINAL REPAIR  06/11/2017  . ANTERIOR CERVICAL DECOMP/DISCECTOMY FUSION  04/03/2012   Procedure: ANTERIOR CERVICAL DECOMPRESSION/DISCECTOMY FUSION 2 LEVELS;  Surgeon: Emilee Hero, MD;  Location: [facility] OR;  Service: Orthopedics;  Laterality: Bilateral;  Anterior cervical decompression fusion, cervical 4-5, cervical 5-6 with instrumentation and allograft.  . APPENDECTOMY    . bladder sling    . BREAST LUMPECTOMY    . CARPAL TUNNEL RELEASE  2011  . CHOLECYSTECTOMY    . DILATION AND CURETTAGE OF UTERUS    . HERNIA REPAIR    . HYSTEROSCOPY  2014  . IMPLANTATION VAGAL NERVE STIMULATOR     and removal in 09/2017  . KNEE ARTHROSCOPY     Left  .  MYOMECTOMY    . OVARY SURGERY  for 2 tumors  . TOOTH EXTRACTION  12/2016  . TUBAL LIGATION      MEDICATIONS: . Ascorbic Acid (VITAMIN C) 1000 MG tablet  . Calcium-Magnesium-Vitamin D (CALCIUM MAGNESIUM PO)  . carboxymethylcellulose (REFRESH PLUS) 0.5 % SOLN  . cholecalciferol (VITAMIN D3) 25 MCG (1000 UT) tablet  . Cyanocobalamin (VITAMIN B-12 IJ)  . cycloSPORINE (RESTASIS) 0.05 % ophthalmic emulsion  . diazepam (VALIUM) 10 MG tablet  . diphenhydrAMINE (BENADRYL) 25 MG tablet  . esomeprazole (NEXIUM) 40 MG capsule  . Estradiol (ELESTRIN) 0.52 MG/0.87 GM (0.06%) GEL  . ezetimibe-simvastatin (VYTORIN)  10-20 MG per tablet  . famotidine (PEPCID) 20 MG tablet  . ferrous sulfate (SLOW RELEASE IRON) 160 (50 Fe) MG TBCR SR tablet  . folic acid (FOLVITE) 400 MCG tablet  . gabapentin (NEURONTIN) 300 MG capsule  . ibuprofen (ADVIL,MOTRIN) 800 MG tablet  . levocetirizine (XYZAL) 5 MG tablet  . Mesalamine (ASACOL HD) 800 MG TBEC  . Omega-3 Fatty Acids (OMEGA-3 FISH OIL PO)  . oxyCODONE-acetaminophen (PERCOCET) 10-325 MG tablet  . progesterone (PROMETRIUM) 100 MG capsule  . promethazine (PHENERGAN) 25 MG tablet  . tamsulosin (FLOMAX) 0.4 MG CAPS capsule  . valsartan-hydrochlorothiazide (DIOVAN-HCT) 160-25 MG per tablet  . vitamin E 400 UNIT capsule   No current facility-administered medications for this encounter.     Shonna Chock, PA-C Surgical Short Stay/Anesthesiology [facility] Phone [phone] [facility] Phone [phone] 12/10/2018 3:29 PM

## 2018-12-10 NOTE — Anesthesia Preprocedure Evaluation (Addendum)
Anesthesia Evaluation  Patient identified by MRN, date of birth, ID band Patient awake    Reviewed: Allergy & Precautions, NPO status , Patient's Chart, lab work & pertinent test results  History of Anesthesia Complications (+) PONV  Airway Mallampati: I  TM Distance: >3 FB Neck ROM: Full    Dental  (+) Teeth Intact, Dental Advisory Given   Pulmonary former smoker,    breath sounds clear to auscultation       Cardiovascular hypertension, Pt. on medications  Rhythm:Regular Rate:Normal     Neuro/Psych  Headaches,    GI/Hepatic Neg liver ROS, GERD  Medicated,  Endo/Other  diabetes  Renal/GU negative Renal ROS     Musculoskeletal  (+) Arthritis ,   Abdominal   Peds  Hematology negative hematology ROS (+)   Anesthesia Other Findings - Crohn's Disease - HLD  - R UE weakness  Reproductive/Obstetrics                           Anesthesia Physical Anesthesia Plan  ASA: II  Anesthesia Plan: General   Post-op Pain Management:    Induction: Intravenous  PONV Risk Score and Plan: 4 or greater and Ondansetron, Dexamethasone, Midazolam and Scopolamine patch - Pre-op  Airway Management Planned: Oral ETT and Video Laryngoscope Planned  Additional Equipment: None  Intra-op Plan:   Post-operative Plan: Extubation in OR  Informed Consent: I have reviewed the patients History and Physical, chart, labs and discussed the procedure including the risks, benefits and alternatives for the proposed anesthesia with the patient or authorized representative who has indicated his/her understanding and acceptance.     Dental advisory given  Plan Discussed with: CRNA  Anesthesia Plan Comments: (PAT note written 12/10/2018 by Shonna Chock, PA-C.  - Jehovah's Witness - no blood, albumin ok)      Anesthesia Quick Evaluation

## 2018-12-10 NOTE — Progress Notes (Signed)
PCP - Dr. Rolley Sims Cardiologist - denies  Chest x-ray - N/A EKG - 12/10/18 Stress Test - requested from [facility]; states it was normal and no f/u needed. ECHO - denies Cardiac Cath - denies  Sleep Study - denies CPAP - denies  Denies being diabetic. Does not check CBG at home.   Blood Thinner Instructions:N/A Aspirin Instructions:N/A  Anesthesia review: Yes.   Patient denies shortness of breath, fever, cough and chest pain at PAT appointment   Patient verbalized understanding of instructions that were given to them at the PAT appointment. Patient was also instructed that they will need to review over the PAT instructions again at home before surgery.

## 2018-12-11 ENCOUNTER — Observation Stay (HOSPITAL_COMMUNITY)
Admission: RE | Admit: 2018-12-11 | Discharge: 2018-12-12 | Disposition: A | Discharge status: Home / Self Care | Payer: BLUE CROSS/BLUE SHIELD | Attending: Orthopedic Surgery | Admitting: Orthopedic Surgery

## 2018-12-11 ENCOUNTER — Ambulatory Visit (HOSPITAL_COMMUNITY): Payer: BLUE CROSS/BLUE SHIELD | Admitting: Vascular Surgery

## 2018-12-11 ENCOUNTER — Ambulatory Visit (HOSPITAL_COMMUNITY): Payer: BLUE CROSS/BLUE SHIELD

## 2018-12-11 ENCOUNTER — Encounter (HOSPITAL_COMMUNITY)
Admission: RE | Disposition: A | Discharge status: Home / Self Care | Payer: Self-pay | Source: Home / Self Care | Attending: Orthopedic Surgery

## 2018-12-11 ENCOUNTER — Encounter (HOSPITAL_COMMUNITY): Payer: Self-pay | Admitting: Certified Registered Nurse Anesthetist

## 2018-12-11 DIAGNOSIS — I10 Essential (primary) hypertension: Secondary | ICD-10-CM | POA: Insufficient documentation

## 2018-12-11 DIAGNOSIS — Z79899 Other long term (current) drug therapy: Secondary | ICD-10-CM | POA: Diagnosis not present

## 2018-12-11 DIAGNOSIS — M4802 Spinal stenosis, cervical region: Secondary | ICD-10-CM | POA: Insufficient documentation

## 2018-12-11 DIAGNOSIS — Z981 Arthrodesis status: Secondary | ICD-10-CM | POA: Insufficient documentation

## 2018-12-11 DIAGNOSIS — Z419 Encounter for procedure for purposes other than remedying health state, unspecified: Secondary | ICD-10-CM

## 2018-12-11 DIAGNOSIS — M50123 Cervical disc disorder at C6-C7 level with radiculopathy: Secondary | ICD-10-CM | POA: Diagnosis not present

## 2018-12-11 DIAGNOSIS — Z79890 Hormone replacement therapy (postmenopausal): Secondary | ICD-10-CM | POA: Diagnosis not present

## 2018-12-11 DIAGNOSIS — E7800 Pure hypercholesterolemia, unspecified: Secondary | ICD-10-CM | POA: Diagnosis not present

## 2018-12-11 DIAGNOSIS — K5090 Crohn's disease, unspecified, without complications: Secondary | ICD-10-CM | POA: Diagnosis not present

## 2018-12-11 DIAGNOSIS — Z87891 Personal history of nicotine dependence: Secondary | ICD-10-CM | POA: Diagnosis not present

## 2018-12-11 DIAGNOSIS — K219 Gastro-esophageal reflux disease without esophagitis: Secondary | ICD-10-CM | POA: Insufficient documentation

## 2018-12-11 DIAGNOSIS — M5410 Radiculopathy, site unspecified: Secondary | ICD-10-CM | POA: Diagnosis present

## 2018-12-11 HISTORY — PX: ANTERIOR CERVICAL DECOMP/DISCECTOMY FUSION: SHX1161

## 2018-12-11 LAB — GLUCOSE, CAPILLARY
Glucose-Capillary: 110 mg/dL — ABNORMAL HIGH (ref 70–99)
Glucose-Capillary: 101 mg/dL — ABNORMAL HIGH (ref 70–99)
Glucose-Capillary: 114 mg/dL — ABNORMAL HIGH (ref 70–99)
Glucose-Capillary: 121 mg/dL — ABNORMAL HIGH (ref 70–99)
Glucose-Capillary: 166 mg/dL — ABNORMAL HIGH (ref 70–99)

## 2018-12-11 SURGERY — ANTERIOR CERVICAL DECOMPRESSION/DISCECTOMY FUSION 2 LEVELS
Anesthesia: General | Site: Neck

## 2018-12-11 MED ORDER — PROMETHAZINE HCL 25 MG PO TABS: 25.0000 mg | ORAL_TABLET | Freq: Four times a day (QID) | ORAL | Status: DC | PRN

## 2018-12-11 MED ORDER — HYDROCHLOROTHIAZIDE 25 MG PO TABS: 25.0000 mg | ORAL_TABLET | Freq: Every day | ORAL | Status: DC

## 2018-12-11 MED ORDER — LACTATED RINGERS IV SOLN
INTRAVENOUS | Status: DC | PRN
  Administered 2018-12-11 (×2): via INTRAVENOUS

## 2018-12-11 MED ORDER — 0.9 % SODIUM CHLORIDE (POUR BTL) OPTIME
TOPICAL | Status: DC | PRN
  Administered 2018-12-11 (×2): 1000 mL

## 2018-12-11 MED ORDER — TAMSULOSIN HCL 0.4 MG PO CAPS
0.4000 mg | ORAL_CAPSULE | Freq: Every day | ORAL | Status: DC
  Administered 2018-12-11: 0.4 mg via ORAL
  Filled 2018-12-11: qty 1

## 2018-12-11 MED ORDER — EZETIMIBE-SIMVASTATIN 10-20 MG PO TABS
1.0000 | ORAL_TABLET | Freq: Every evening | ORAL | Status: DC
  Administered 2018-12-11: 1 via ORAL
  Filled 2018-12-11: qty 1

## 2018-12-11 MED ORDER — ONDANSETRON HCL 4 MG/2ML IJ SOLN
INTRAMUSCULAR | Status: DC | PRN
  Administered 2018-12-11: 4 mg via INTRAVENOUS

## 2018-12-11 MED ORDER — ALUM & MAG HYDROXIDE-SIMETH 200-200-20 MG/5ML PO SUSP: 30.0000 mL | Freq: Four times a day (QID) | ORAL | Status: DC | PRN

## 2018-12-11 MED ORDER — DIAZEPAM 5 MG PO TABS
ORAL_TABLET | ORAL | Status: AC
  Filled 2018-12-11: qty 1

## 2018-12-11 MED ORDER — LACTATED RINGERS IV SOLN: INTRAVENOUS | Status: DC

## 2018-12-11 MED ORDER — OXYCODONE-ACETAMINOPHEN 5-325 MG PO TABS
1.0000 | ORAL_TABLET | ORAL | Status: DC | PRN
  Administered 2018-12-11 – 2018-12-12 (×5): 2 via ORAL
  Filled 2018-12-11 (×5): qty 2

## 2018-12-11 MED ORDER — THROMBIN 20000 UNITS EX SOLR: CUTANEOUS | Status: DC | PRN

## 2018-12-11 MED ORDER — PANTOPRAZOLE SODIUM 40 MG IV SOLR: 40.0000 mg | Freq: Every day | INTRAVENOUS | Status: DC

## 2018-12-11 MED ORDER — SODIUM CHLORIDE 0.9% FLUSH: 3.0000 mL | INTRAVENOUS | Status: DC | PRN

## 2018-12-11 MED ORDER — SUGAMMADEX SODIUM 200 MG/2ML IV SOLN
INTRAVENOUS | Status: DC | PRN
  Administered 2018-12-11: 277.6 mg via INTRAVENOUS

## 2018-12-11 MED ORDER — DEXAMETHASONE SODIUM PHOSPHATE 10 MG/ML IJ SOLN
INTRAMUSCULAR | Status: DC | PRN
  Administered 2018-12-11: 10 mg via INTRAVENOUS

## 2018-12-11 MED ORDER — ACETAMINOPHEN 325 MG PO TABS: 650.0000 mg | ORAL_TABLET | ORAL | Status: DC | PRN

## 2018-12-11 MED ORDER — LORATADINE 10 MG PO TABS
10.0000 mg | ORAL_TABLET | Freq: Two times a day (BID) | ORAL | Status: DC
  Administered 2018-12-11: 10 mg via ORAL
  Filled 2018-12-11: qty 1

## 2018-12-11 MED ORDER — IRBESARTAN 150 MG PO TABS
150.0000 mg | ORAL_TABLET | Freq: Every day | ORAL | Status: DC
  Filled 2018-12-11: qty 1

## 2018-12-11 MED ORDER — CYCLOSPORINE 0.05 % OP EMUL: 1.0000 [drp] | Freq: Two times a day (BID) | OPHTHALMIC | Status: DC | PRN

## 2018-12-11 MED ORDER — SCOPOLAMINE 1 MG/3DAYS TD PT72
1.0000 | MEDICATED_PATCH | Freq: Once | TRANSDERMAL | Status: DC
  Administered 2018-12-11: 1.5 mg via TRANSDERMAL

## 2018-12-11 MED ORDER — ACETAMINOPHEN 10 MG/ML IV SOLN
INTRAVENOUS | Status: AC
  Administered 2018-12-11: 1000 mg via INTRAVENOUS
  Filled 2018-12-11: qty 100

## 2018-12-11 MED ORDER — CEFAZOLIN SODIUM-DEXTROSE 2-4 GM/100ML-% IV SOLN
2.0000 g | Freq: Three times a day (TID) | INTRAVENOUS | Status: AC
  Administered 2018-12-11 – 2018-12-12 (×2): 2 g via INTRAVENOUS
  Filled 2018-12-11 (×2): qty 100

## 2018-12-11 MED ORDER — DIPHENHYDRAMINE HCL 25 MG PO CAPS: 25.0000 mg | ORAL_CAPSULE | Freq: Four times a day (QID) | ORAL | Status: DC | PRN

## 2018-12-11 MED ORDER — FLEET ENEMA 7-19 GM/118ML RE ENEM: 1.0000 | ENEMA | Freq: Once | RECTAL | Status: DC | PRN

## 2018-12-11 MED ORDER — ROCURONIUM BROMIDE 10 MG/ML (PF) SYRINGE
PREFILLED_SYRINGE | INTRAVENOUS | Status: DC | PRN
  Administered 2018-12-11: 50 mg via INTRAVENOUS
  Administered 2018-12-11: 20 mg via INTRAVENOUS

## 2018-12-11 MED ORDER — GABAPENTIN 300 MG PO CAPS
300.0000 mg | ORAL_CAPSULE | Freq: Three times a day (TID) | ORAL | Status: DC | PRN
  Administered 2018-12-11: 300 mg via ORAL
  Filled 2018-12-11: qty 1

## 2018-12-11 MED ORDER — HYDROMORPHONE HCL 1 MG/ML IJ SOLN
0.2500 mg | INTRAMUSCULAR | Status: DC | PRN
  Administered 2018-12-11 (×2): 0.5 mg via INTRAVENOUS

## 2018-12-11 MED ORDER — BISACODYL 5 MG PO TBEC: 5.0000 mg | DELAYED_RELEASE_TABLET | Freq: Every day | ORAL | Status: DC | PRN

## 2018-12-11 MED ORDER — SENNOSIDES-DOCUSATE SODIUM 8.6-50 MG PO TABS: 1.0000 | ORAL_TABLET | Freq: Every evening | ORAL | Status: DC | PRN

## 2018-12-11 MED ORDER — ACETAMINOPHEN 160 MG/5ML PO SOLN: 325.0000 mg | Freq: Once | ORAL | Status: DC

## 2018-12-11 MED ORDER — ROCURONIUM BROMIDE 50 MG/5ML IV SOSY
PREFILLED_SYRINGE | INTRAVENOUS | Status: AC
  Filled 2018-12-11: qty 5

## 2018-12-11 MED ORDER — PROPOFOL 10 MG/ML IV BOLUS
INTRAVENOUS | Status: DC | PRN
  Administered 2018-12-11: 200 mg via INTRAVENOUS

## 2018-12-11 MED ORDER — HYDROMORPHONE HCL 1 MG/ML IJ SOLN
INTRAMUSCULAR | Status: AC
  Administered 2018-12-11: 0.5 mg via INTRAVENOUS
  Filled 2018-12-11: qty 1

## 2018-12-11 MED ORDER — FENTANYL CITRATE (PF) 250 MCG/5ML IJ SOLN
INTRAMUSCULAR | Status: AC
  Filled 2018-12-11: qty 5

## 2018-12-11 MED ORDER — POVIDONE-IODINE 7.5 % EX SOLN
Freq: Once | CUTANEOUS | Status: DC
  Filled 2018-12-11: qty 118

## 2018-12-11 MED ORDER — FOLIC ACID 1 MG PO TABS: 0.5000 mg | ORAL_TABLET | Freq: Every day | ORAL | Status: DC

## 2018-12-11 MED ORDER — THROMBIN 20000 UNITS EX SOLR
CUTANEOUS | Status: DC | PRN
  Administered 2018-12-11: 14:00:00 via TOPICAL

## 2018-12-11 MED ORDER — LEVOCETIRIZINE DIHYDROCHLORIDE 5 MG PO TABS: 5.0000 mg | ORAL_TABLET | Freq: Two times a day (BID) | ORAL | Status: DC

## 2018-12-11 MED ORDER — ACETAMINOPHEN 10 MG/ML IV SOLN
1000.0000 mg | Freq: Once | INTRAVENOUS | Status: DC | PRN
  Administered 2018-12-11: 1000 mg via INTRAVENOUS

## 2018-12-11 MED ORDER — MEPERIDINE HCL 50 MG/ML IJ SOLN: 6.2500 mg | INTRAMUSCULAR | Status: DC | PRN

## 2018-12-11 MED ORDER — BUPIVACAINE-EPINEPHRINE 0.25% -1:200000 IJ SOLN
INTRAMUSCULAR | Status: DC | PRN
  Administered 2018-12-11: 6 mL

## 2018-12-11 MED ORDER — THROMBIN 20000 UNITS EX SOLR
CUTANEOUS | Status: AC
  Filled 2018-12-11: qty 20000

## 2018-12-11 MED ORDER — MIDAZOLAM HCL 2 MG/2ML IJ SOLN
INTRAMUSCULAR | Status: DC | PRN
  Administered 2018-12-11: 2 mg via INTRAVENOUS

## 2018-12-11 MED ORDER — EPHEDRINE SULFATE-NACL 50-0.9 MG/10ML-% IV SOSY
PREFILLED_SYRINGE | INTRAVENOUS | Status: DC | PRN
  Administered 2018-12-11: 5 mg via INTRAVENOUS

## 2018-12-11 MED ORDER — SODIUM CHLORIDE 0.9 % IV SOLN
INTRAVENOUS | Status: DC | PRN
  Administered 2018-12-11: 50 ug/min via INTRAVENOUS

## 2018-12-11 MED ORDER — VALSARTAN-HYDROCHLOROTHIAZIDE 160-25 MG PO TABS: 1.0000 | ORAL_TABLET | Freq: Every day | ORAL | Status: DC

## 2018-12-11 MED ORDER — ACETAMINOPHEN 650 MG RE SUPP: 650.0000 mg | RECTAL | Status: DC | PRN

## 2018-12-11 MED ORDER — CEFAZOLIN SODIUM-DEXTROSE 2-4 GM/100ML-% IV SOLN
2.0000 g | INTRAVENOUS | Status: AC
  Administered 2018-12-11: 2 g via INTRAVENOUS

## 2018-12-11 MED ORDER — ACETAMINOPHEN 325 MG PO TABS: 325.0000 mg | ORAL_TABLET | Freq: Once | ORAL | Status: DC

## 2018-12-11 MED ORDER — ZOLPIDEM TARTRATE 5 MG PO TABS
5.0000 mg | ORAL_TABLET | Freq: Every evening | ORAL | Status: DC | PRN
  Administered 2018-12-11: 5 mg via ORAL
  Filled 2018-12-11: qty 1

## 2018-12-11 MED ORDER — SCOPOLAMINE 1 MG/3DAYS TD PT72
MEDICATED_PATCH | TRANSDERMAL | Status: AC
  Filled 2018-12-11: qty 1

## 2018-12-11 MED ORDER — LIDOCAINE 2% (20 MG/ML) 5 ML SYRINGE
INTRAMUSCULAR | Status: AC
  Filled 2018-12-11: qty 5

## 2018-12-11 MED ORDER — DIPHENHYDRAMINE HCL 25 MG PO TABS
25.0000 mg | ORAL_TABLET | Freq: Four times a day (QID) | ORAL | Status: DC
  Filled 2018-12-11: qty 1

## 2018-12-11 MED ORDER — ONDANSETRON HCL 4 MG PO TABS: 4.0000 mg | ORAL_TABLET | Freq: Four times a day (QID) | ORAL | Status: DC | PRN

## 2018-12-11 MED ORDER — PROGESTERONE MICRONIZED 100 MG PO CAPS
100.0000 mg | ORAL_CAPSULE | Freq: Every day | ORAL | Status: DC
  Administered 2018-12-11: 100 mg via ORAL
  Filled 2018-12-11: qty 1

## 2018-12-11 MED ORDER — DOCUSATE SODIUM 100 MG PO CAPS
100.0000 mg | ORAL_CAPSULE | Freq: Two times a day (BID) | ORAL | Status: DC
  Administered 2018-12-11: 100 mg via ORAL
  Filled 2018-12-11: qty 1

## 2018-12-11 MED ORDER — MESALAMINE 400 MG PO CPDR
800.0000 mg | DELAYED_RELEASE_CAPSULE | Freq: Four times a day (QID) | ORAL | Status: DC
  Administered 2018-12-11 (×2): 800 mg via ORAL
  Filled 2018-12-11 (×4): qty 2

## 2018-12-11 MED ORDER — SODIUM CHLORIDE 0.9% FLUSH
3.0000 mL | Freq: Two times a day (BID) | INTRAVENOUS | Status: DC
  Administered 2018-12-11: 3 mL via INTRAVENOUS

## 2018-12-11 MED ORDER — DIAZEPAM 5 MG PO TABS
5.0000 mg | ORAL_TABLET | Freq: Four times a day (QID) | ORAL | Status: DC | PRN
  Administered 2018-12-11 – 2018-12-12 (×3): 5 mg via ORAL
  Filled 2018-12-11 (×2): qty 1

## 2018-12-11 MED ORDER — PHENYLEPHRINE 40 MCG/ML (10ML) SYRINGE FOR IV PUSH (FOR BLOOD PRESSURE SUPPORT)
PREFILLED_SYRINGE | INTRAVENOUS | Status: DC | PRN
  Administered 2018-12-11: 80 ug via INTRAVENOUS
  Administered 2018-12-11: 200 ug via INTRAVENOUS
  Administered 2018-12-11: 80 ug via INTRAVENOUS

## 2018-12-11 MED ORDER — ONDANSETRON HCL 4 MG/2ML IJ SOLN: 4.0000 mg | Freq: Four times a day (QID) | INTRAMUSCULAR | Status: DC | PRN

## 2018-12-11 MED ORDER — FOLIC ACID 400 MCG PO TABS: 400.0000 ug | ORAL_TABLET | Freq: Every day | ORAL | Status: DC

## 2018-12-11 MED ORDER — HEMOSTATIC AGENTS (NO CHARGE) OPTIME
TOPICAL | Status: DC | PRN
  Administered 2018-12-11: 1 via TOPICAL

## 2018-12-11 MED ORDER — DEXAMETHASONE SODIUM PHOSPHATE 10 MG/ML IJ SOLN
INTRAMUSCULAR | Status: AC
  Filled 2018-12-11: qty 1

## 2018-12-11 MED ORDER — FENTANYL CITRATE (PF) 250 MCG/5ML IJ SOLN
INTRAMUSCULAR | Status: DC | PRN
  Administered 2018-12-11: 100 ug via INTRAVENOUS
  Administered 2018-12-11: 50 ug via INTRAVENOUS
  Administered 2018-12-11: 25 ug via INTRAVENOUS

## 2018-12-11 MED ORDER — ONDANSETRON HCL 4 MG/2ML IJ SOLN
INTRAMUSCULAR | Status: AC
  Filled 2018-12-11: qty 2

## 2018-12-11 MED ORDER — MIDAZOLAM HCL 2 MG/2ML IJ SOLN
INTRAMUSCULAR | Status: AC
  Filled 2018-12-11: qty 2

## 2018-12-11 MED ORDER — VITAMIN E 180 MG (400 UNIT) PO CAPS: 400.0000 [IU] | ORAL_CAPSULE | Freq: Every day | ORAL | Status: DC

## 2018-12-11 MED ORDER — PROMETHAZINE HCL 25 MG/ML IJ SOLN: 6.2500 mg | INTRAMUSCULAR | Status: DC | PRN

## 2018-12-11 MED ORDER — LIDOCAINE HCL (CARDIAC) PF 100 MG/5ML IV SOSY
PREFILLED_SYRINGE | INTRAVENOUS | Status: DC | PRN
  Administered 2018-12-11: 80 mg via INTRAVENOUS

## 2018-12-11 MED ORDER — MENTHOL 3 MG MT LOZG
1.0000 | LOZENGE | OROMUCOSAL | Status: DC | PRN
  Filled 2018-12-11: qty 9

## 2018-12-11 MED ORDER — FAMOTIDINE 20 MG PO TABS: 20.0000 mg | ORAL_TABLET | Freq: Two times a day (BID) | ORAL | Status: DC

## 2018-12-11 MED ORDER — FERROUS SULFATE DRIED ER 160 (50 FE) MG PO TBCR: 160.0000 mg | EXTENDED_RELEASE_TABLET | Freq: Two times a day (BID) | ORAL | Status: DC

## 2018-12-11 MED ORDER — BUPIVACAINE-EPINEPHRINE (PF) 0.5% -1:200000 IJ SOLN
INTRAMUSCULAR | Status: AC
  Filled 2018-12-11: qty 30

## 2018-12-11 MED ORDER — PANTOPRAZOLE SODIUM 40 MG PO TBEC
40.0000 mg | DELAYED_RELEASE_TABLET | Freq: Every day | ORAL | Status: DC
  Administered 2018-12-12: 40 mg via ORAL
  Filled 2018-12-11: qty 1

## 2018-12-11 MED ORDER — PHENOL 1.4 % MT LIQD
1.0000 | OROMUCOSAL | Status: DC | PRN
  Administered 2018-12-11: 1 via OROMUCOSAL
  Filled 2018-12-11: qty 177

## 2018-12-11 MED ORDER — DIPHENHYDRAMINE HCL 25 MG PO CAPS
25.0000 mg | ORAL_CAPSULE | Freq: Four times a day (QID) | ORAL | Status: DC
  Filled 2018-12-11: qty 1

## 2018-12-11 MED ORDER — CEFAZOLIN SODIUM-DEXTROSE 2-4 GM/100ML-% IV SOLN
INTRAVENOUS | Status: AC
  Filled 2018-12-11: qty 100

## 2018-12-11 SURGICAL SUPPLY — 81 items
BENZOIN TINCTURE PRP APPL 2/3 (GAUZE/BANDAGES/DRESSINGS) ×3 IMPLANT
BIT DRILL NEURO 2X3.1 SFT TUCH (MISCELLANEOUS) ×1 IMPLANT
BIT DRILL SRG 14X2.2XFLT CHK (BIT) ×1 IMPLANT
BIT DRL SRG 14X2.2XFLT CHK (BIT) ×1
BLADE CLIPPER SURG (BLADE) IMPLANT
BLADE SURG 15 STRL LF DISP TIS (BLADE) ×1 IMPLANT
BLADE SURG 15 STRL SS (BLADE) ×2
BONE VIVIGEN FORMABLE 1.3CC (Bone Implant) ×3 IMPLANT
BUR MATCHSTICK NEURO 3.0 LAGG (BURR) ×3 IMPLANT
CARTRIDGE OIL MAESTRO DRILL (MISCELLANEOUS) ×1 IMPLANT
CLOSURE WOUND 1/2 X4 (GAUZE/BANDAGES/DRESSINGS) ×1
COLLAR CERV LO CONTOUR FIRM DE (SOFTGOODS) IMPLANT
CORDS BIPOLAR (ELECTRODE) IMPLANT
COVER SURGICAL LIGHT HANDLE (MISCELLANEOUS) IMPLANT
COVER WAND RF STERILE (DRAPES) IMPLANT
CRADLE DONUT ADULT HEAD (MISCELLANEOUS) ×3 IMPLANT
DECANTER SPIKE VIAL GLASS SM (MISCELLANEOUS) ×3 IMPLANT
DIFFUSER DRILL AIR PNEUMATIC (MISCELLANEOUS) ×3 IMPLANT
DRAIN JACKSON RD 7FR 3/32 (WOUND CARE) IMPLANT
DRAPE C-ARM 42X72 X-RAY (DRAPES) ×3 IMPLANT
DRAPE POUCH INSTRU U-SHP 10X18 (DRAPES) IMPLANT
DRAPE SURG 17X23 STRL (DRAPES) ×12 IMPLANT
DRILL BIT SKYLINE 14MM (BIT) ×2
DRILL NEURO 2X3.1 SOFT TOUCH (MISCELLANEOUS) ×3
DURAPREP 26ML APPLICATOR (WOUND CARE) ×3 IMPLANT
ELECT COATED BLADE 2.86 ST (ELECTRODE) ×3 IMPLANT
ELECT REM PT RETURN 9FT ADLT (ELECTROSURGICAL) ×3
ELECTRODE REM PT RTRN 9FT ADLT (ELECTROSURGICAL) ×1 IMPLANT
EVACUATOR SILICONE 100CC (DRAIN) IMPLANT
GAUZE 4X4 16PLY RFD (DISPOSABLE) IMPLANT
GAUZE SPONGE 4X4 12PLY STRL (GAUZE/BANDAGES/DRESSINGS) ×3 IMPLANT
GLOVE BIO SURGEON STRL SZ7 (GLOVE) ×3 IMPLANT
GLOVE BIO SURGEON STRL SZ8 (GLOVE) ×3 IMPLANT
GLOVE BIOGEL PI IND STRL 7.0 (GLOVE) ×5 IMPLANT
GLOVE BIOGEL PI IND STRL 7.5 (GLOVE) ×1 IMPLANT
GLOVE BIOGEL PI IND STRL 8 (GLOVE) ×1 IMPLANT
GLOVE BIOGEL PI INDICATOR 7.0 (GLOVE) ×10
GLOVE BIOGEL PI INDICATOR 7.5 (GLOVE) ×2
GLOVE BIOGEL PI INDICATOR 8 (GLOVE) ×2
GOWN STRL REUS W/ TWL LRG LVL3 (GOWN DISPOSABLE) ×1 IMPLANT
GOWN STRL REUS W/ TWL XL LVL3 (GOWN DISPOSABLE) ×2 IMPLANT
GOWN STRL REUS W/TWL LRG LVL3 (GOWN DISPOSABLE) ×2
GOWN STRL REUS W/TWL XL LVL3 (GOWN DISPOSABLE) ×4
INTERLOCK LRDTC CRVCL VBR 6MM (Bone Implant) ×1 IMPLANT
INTERLOCK LRDTC CRVCL VBR 8MM (Peek) ×1 IMPLANT
IV CATH 14GX2 1/4 (CATHETERS) ×3 IMPLANT
KIT BASIN OR (CUSTOM PROCEDURE TRAY) ×3 IMPLANT
KIT TURNOVER KIT B (KITS) ×3 IMPLANT
LORDOTIC CERVICAL VBR 6MM SM (Bone Implant) ×3 IMPLANT
LORDOTIC CERVICAL VBR 8MM SM (Peek) ×3 IMPLANT
MANIFOLD NEPTUNE II (INSTRUMENTS) IMPLANT
NEEDLE PRECISIONGLIDE 27X1.5 (NEEDLE) ×3 IMPLANT
NEEDLE SPNL 20GX3.5 QUINCKE YW (NEEDLE) ×3 IMPLANT
NS IRRIG 1000ML POUR BTL (IV SOLUTION) ×6 IMPLANT
OIL CARTRIDGE MAESTRO DRILL (MISCELLANEOUS) ×3
PACK ORTHO CERVICAL (CUSTOM PROCEDURE TRAY) ×3 IMPLANT
PAD ARMBOARD 7.5X6 YLW CONV (MISCELLANEOUS) ×6 IMPLANT
PATTIES SURGICAL .5 X.5 (GAUZE/BANDAGES/DRESSINGS) IMPLANT
PATTIES SURGICAL .5 X1 (DISPOSABLE) ×3 IMPLANT
PIN DISTRACTION 14 (PIN) ×6 IMPLANT
PLATE SKYLINE 12MM (Plate) ×3 IMPLANT
SCREW SKYLINE VAR OS 14MM (Screw) ×9 IMPLANT
SCREW VAR SELF TAP SKYLINE 14M (Screw) ×3 IMPLANT
SPONGE INTESTINAL PEANUT (DISPOSABLE) ×3 IMPLANT
SPONGE SURGIFOAM ABS GEL 100 (HEMOSTASIS) ×3 IMPLANT
STRIP CLOSURE SKIN 1/2X4 (GAUZE/BANDAGES/DRESSINGS) ×2 IMPLANT
SURGIFLO W/THROMBIN 8M KIT (HEMOSTASIS) ×3 IMPLANT
SUT MNCRL AB 4-0 PS2 18 (SUTURE) ×3 IMPLANT
SUT SILK 4 0 (SUTURE)
SUT SILK 4-0 18XBRD TIE 12 (SUTURE) IMPLANT
SUT VIC AB 2-0 CT2 18 VCP726D (SUTURE) ×3 IMPLANT
SYR 20ML ECCENTRIC (SYRINGE) ×3 IMPLANT
SYR BULB IRRIGATION 50ML (SYRINGE) ×3 IMPLANT
SYR CONTROL 10ML LL (SYRINGE) ×9 IMPLANT
TAPE CLOTH 4X10 WHT NS (GAUZE/BANDAGES/DRESSINGS) ×3 IMPLANT
TAPE CLOTH SURG 4X10 WHT LF (GAUZE/BANDAGES/DRESSINGS) ×3 IMPLANT
TAPE UMBILICAL COTTON 1/8X30 (MISCELLANEOUS) ×3 IMPLANT
TOWEL OR 17X24 6PK STRL BLUE (TOWEL DISPOSABLE) ×3 IMPLANT
TOWEL OR 17X26 10 PK STRL BLUE (TOWEL DISPOSABLE) ×3 IMPLANT
WATER STERILE IRR 1000ML POUR (IV SOLUTION) ×3 IMPLANT
YANKAUER SUCT BULB TIP NO VENT (SUCTIONS) ×3 IMPLANT

## 2018-12-11 NOTE — Op Note (Signed)
PATIENT NAME: Tanya Mcdaniel   MEDICAL RECORD NO.:   [id number]    PHYSICIAN:  Estill Bamberg, MD      DATE OF BIRTH: Jul 17, 1961   DATE OF PROCEDURE: 12/11/2018                               OPERATIVE REPORT     PREOPERATIVE DIAGNOSES: 1. Right-sided cervical radiculopathy. 2. 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   POSTOPERATIVE DIAGNOSES: 1. Right-sided cervical radiculopathy. 2. 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   PROCEDURE: 1. Anterior cervical decompression and fusion C6/7. 2. Placement of anterior instrumentation, C6/7. 3. Insertion of interbody device x1 (2mm Titan intervertebral spacer). 4. Intraoperative use of fluoroscopy. 5. Use of morselized allograft - ViviGen.   SURGEON:  Estill Bamberg, MD   ASSISTANT:  Jason Coop, PA-C.   ANESTHESIA:  General endotracheal anesthesia.   COMPLICATIONS:  None.   DISPOSITION:  Stable.   ESTIMATED BLOOD LOSS:  Minimal.   INDICATIONS FOR SURGERY:  Briefly, [name]  is a pleasant 58 -year- old female, who did present to me with severe pain in her neck and right arm.   The patient's MRI did reveal the findings noted above.  Given the ongoing rather debilitating pain and lack of improvement with appropriate treatment measures, we did discuss proceeding with the procedure noted above.  The patient was fully aware of the risks and limitations of surgery as outlined in my preoperative note.   OPERATIVE DETAILS:  On 12/11/2018 , the patient was brought to surgery and general endotracheal anesthesia was administered.  The patient was placed supine on the hospital bed. The neck was gently extended.  All bony prominences were meticulously padded.  The neck was prepped and draped in the usual sterile fashion.  At this point, I did make a left-sided transverse incision.  The platysma was incised.  A Smith-Robinson approach was used and the anterior spine was identified. A self-retaining retractor was placed. The lower plate was noted, and  the lower 2 screws were removed uneventfully.  I then subperiosteally exposed the vertebral bodies from C6-C7.  Caspar pins were then placed into the C6 and C7 vertebral bodies and distraction was applied.  A thorough and complete C6-7 intervertebral diskectomy was performed.  The posterior longitudinal ligament was identified and entered using a nerve hook.  I then used #1 followed by #2 Kerrison to perform a thorough and complete intervertebral diskectomy.  The spinal canal was thoroughly decompressed, as was the right neuroforamen.  The endplates were then prepared and the appropriate-sized intervertebral spacer was then packed with ViviGen and tamped into position in the usual fashion. The Caspar pins  then were removed and bone wax was placed in their place.  The lower 2 screws from the upper plate were removed, and the appropriate-sized anterior cervical plate was placed over the anterior spine.  14 mm variable angle screws were placed, 2 in each vertebral body from C6-C7 for a total of 4 vertebral body screws.  The screws were then locked to the plate using the Cam locking mechanism.  I was very pleased with the final fluoroscopic images.  The wound was then irrigated.  The wound was then explored for any undue bleeding and there was no bleeding noted. The wound was then closed in layers using 2-0 Vicryl, followed by 4-0 Monocryl.  Benzoin and Steri-Strips were applied, followed by sterile dressing.  All instrument counts were correct at the  termination of the procedure.   Of note, Jason Coop, PA-C, was my assistant throughout surgery, and did aid in retraction, suctioning, and closure from start to finish.         Estill Bamberg, MD

## 2018-12-11 NOTE — Anesthesia Procedure Notes (Signed)
Procedure Name: Intubation Date/Time: 12/11/2018 12:45 PM Performed by: Yolonda Kida, CRNA Pre-anesthesia Checklist: Patient identified, Emergency Drugs available, Suction available and Patient being monitored Patient Re-evaluated:Patient Re-evaluated prior to induction Oxygen Delivery Method: Circle system utilized Preoxygenation: Pre-oxygenation with 100% oxygen Induction Type: IV induction Ventilation: Mask ventilation without difficulty Laryngoscope Size: Glidescope and 3 Grade View: Grade I Tube type: Oral Tube size: 7.0 mm Number of attempts: 1 Airway Equipment and Method: Stylet and Video-laryngoscopy Placement Confirmation: ETT inserted through vocal cords under direct vision,  positive ETCO2,  CO2 detector and breath sounds checked- equal and bilateral Secured at: 21 cm Tube secured with: Tape Dental Injury: Teeth and Oropharynx as per pre-operative assessment

## 2018-12-11 NOTE — H&P (Signed)
PREOPERATIVE H&P  Chief Complaint: Right arm pain  HPI: Tanya Mcdaniel is a 58 y.o. 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 Anemia   . Arthritis   . Carpal tunnel syndrome   . Cervical herniated disc   . Crohn's disease (HCC)   . Crohn's disease (HCC)   . Diabetes mellitus    borderline  . Family history of anesthesia complication    mother difficulty waking up  . GERD (gastroesophageal reflux disease)   . Headache(784.0)   . Hypercholesterolemia   . Hypertension   . Neuromuscular disorder (HCC)   . Peripheral edema   . PONV (postoperative nausea and vomiting)   . Rh incompatibility 1981  . Urticaria 01/2011   Past Surgical History:  Procedure Laterality Date  . ANTERIOR AND POSTERIOR VAGINAL REPAIR  06/11/2017  . ANTERIOR CERVICAL DECOMP/DISCECTOMY FUSION  04/03/2012   Procedure: ANTERIOR CERVICAL DECOMPRESSION/DISCECTOMY FUSION 2 LEVELS;  Surgeon: Emilee Hero, MD;  Location: [facility] OR;  Service: Orthopedics;  Laterality: Bilateral;  Anterior cervical decompression fusion, cervical 4-5, cervical 5-6 with instrumentation and allograft.  . APPENDECTOMY    . bladder sling    . BREAST LUMPECTOMY    . CARPAL TUNNEL RELEASE  2011  . CHOLECYSTECTOMY    . DILATION AND CURETTAGE OF UTERUS    . HERNIA REPAIR    . HYSTEROSCOPY  2014  . IMPLANTATION VAGAL NERVE STIMULATOR     and removal in 09/2017  . KNEE ARTHROSCOPY     Left  . MYOMECTOMY    . OVARY SURGERY  for 2 tumors  . TOOTH EXTRACTION  12/2016  . TUBAL LIGATION     Social History   Socioeconomic History  . Marital status: Married    Spouse name: Not on file  . Number of children: Not on  file  . Years of education: Not on file  . Highest education level: Not on file  Occupational History  . Not on file  Social Needs  . Financial resource strain: Not on file  . Food insecurity:    Worry: Not on file    Inability: Not on file  . Transportation needs:    Medical: Not on file    Non-medical: Not on file  Tobacco Use  . Smoking status: Former Smoker    Types: Cigarettes    Last attempt to quit: 03/02/2002    Years since quitting: 16.7  . Smokeless tobacco: Never Used  Substance and Sexual Activity  . Alcohol use: Yes    Alcohol/week: 5.0 standard drinks    Types: 5 Glasses of wine per week  . Drug use: No  . Sexual activity: Not on file  Lifestyle  . Physical activity:    Days per week: Not on file    Minutes per session: Not on file  . Stress: Not on file  Relationships  . Social connections:    Talks on phone: Not on file    Gets together: Not on file    Attends religious service: Not on file    Active member of club or organization: Not on file    Attends meetings of clubs or organizations: Not  on file    Relationship status: Not on file  Other Topics Concern  . Not on file  Social History Narrative  . Not on file   Family History  Problem Relation Age of Onset  . Hypertension Other   . Stroke Other   . Heart attack Other    Allergies  Allergen Reactions  . Sulfa Antibiotics Rash   Prior to Admission medications   Medication Sig Start Date End Date Taking? Authorizing Provider  Ascorbic Acid (VITAMIN C) 1000 MG tablet Take 1,000 mg by mouth daily.   Yes [provider]  Calcium-Magnesium-Vitamin D (CALCIUM MAGNESIUM PO) Take 1 tablet by mouth daily.   Yes [provider]  carboxymethylcellulose (REFRESH PLUS) 0.5 % SOLN Place 1 drop into both eyes 3 (three) times daily as needed (dry eyes).   Yes [provider]  cholecalciferol (VITAMIN D3) 25 MCG (1000 UT) tablet Take 1,000 Units by mouth 2 (two) times daily.   Yes  [provider]  Cyanocobalamin (VITAMIN B-12 IJ) Inject 1,000 mcg as directed every 14 (fourteen) days.   Yes [provider]  cycloSPORINE (RESTASIS) 0.05 % ophthalmic emulsion Place 1 drop into both eyes 2 (two) times daily as needed (dry eyes).   Yes [provider]  diazepam (VALIUM) 10 MG tablet Take 5 mg by mouth at bedtime. For spasms    Yes [provider]  esomeprazole (NEXIUM) 40 MG capsule Take 40 mg by mouth daily before breakfast.   Yes [provider]  Estradiol (ELESTRIN) 0.52 MG/0.87 GM (0.06%) GEL Apply 1 application topically every morning.   Yes [provider]  ezetimibe-simvastatin (VYTORIN) 10-20 MG per tablet Take 1 tablet by mouth every evening.    Yes [provider]  ferrous sulfate (SLOW RELEASE IRON) 160 (50 Fe) MG TBCR SR tablet Take 160 mg by mouth 2 (two) times daily.   Yes [provider]  folic acid (FOLVITE) 400 MCG tablet Take 400 mcg by mouth daily.   Yes [provider]  gabapentin (NEURONTIN) 300 MG capsule Take 300 mg by mouth 3 (three) times daily as needed (neuropathy).   Yes [provider]  levocetirizine (XYZAL) 5 MG tablet Take 5 mg by mouth 2 (two) times daily.   Yes [provider]  Mesalamine (ASACOL HD) 800 MG TBEC Take 800 mg by mouth 4 (four) times daily.   Yes [provider]  Omega-3 Fatty Acids (OMEGA-3 FISH OIL PO) Take 1,280 mg by mouth daily.   Yes [provider]  oxyCODONE-acetaminophen (PERCOCET) 10-325 MG tablet Take 0.5 tablets by mouth every 4 (four) hours as needed for pain.   Yes [provider]  progesterone (PROMETRIUM) 100 MG capsule Take 100 mg by mouth at bedtime.   Yes [provider]  tamsulosin (FLOMAX) 0.4 MG CAPS capsule Take 0.4 mg by mouth at bedtime.   Yes [provider]  valsartan-hydrochlorothiazide (DIOVAN-HCT) 160-25 MG per tablet Take 1 tablet by mouth daily.   Yes [provider]  vitamin E 400 UNIT capsule Take 400 Units by mouth daily.   Yes [provider]  diphenhydrAMINE (BENADRYL) 25 MG tablet Take 1 tablet (25 mg total) by mouth every 6 (six) hours. 04/14/12 05/14/12  Elpidio Anis, PA-C  famotidine (PEPCID) 20 MG tablet Take 1 tablet (20 mg total) by mouth 2 (two) times daily. 04/14/12 04/14/13  Elpidio Anis, PA-C  ibuprofen (ADVIL,MOTRIN) 800 MG tablet Take 800 mg by mouth every 8 (eight)  hours as needed for moderate pain.    [provider]  promethazine (PHENERGAN) 25 MG tablet Take 25 mg by mouth every 6 (six) hours as needed for nausea or vomiting.     [provider]     All other systems have been reviewed and were otherwise negative with the exception of those mentioned in the HPI and as above.  Physical Exam: Vitals:   12/11/18 0907  BP: (!) 141/71  Pulse: (!) 109  Resp: 20  Temp: 98.1 F (36.7 C)  SpO2: 99%    There is no height or weight on file to calculate BMI.  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  Plan for Procedure(s): ANTERIOR CERVICAL DECOMPRESSION FUSION , CERVICAL 6-7 WITH INSTRUMENTATION AND ALLOGRAFT   Jackelyn Hoehn, MD 12/11/2018 10:29 AM

## 2018-12-11 NOTE — Anesthesia Postprocedure Evaluation (Signed)
Anesthesia Post Note  Patient: Tanya Mcdaniel  Procedure(s) Performed: ANTERIOR CERVICAL DECOMPRESSION FUSION , CERVICAL SIX-SEVEN WITH INSTRUMENTATION AND ALLOGRAFT (N/A Neck)     Patient location during evaluation: PACU Anesthesia Type: General Level of consciousness: awake and alert Pain management: pain level controlled Vital Signs Assessment: post-procedure vital signs reviewed and stable Respiratory status: spontaneous breathing, nonlabored ventilation, respiratory function stable and patient connected to nasal cannula oxygen Cardiovascular status: blood pressure returned to baseline and stable Postop Assessment: no apparent nausea or vomiting Anesthetic complications: no    Last Vitals:  Vitals:   12/11/18 1548 12/11/18 1608  BP: 125/87 136/71  Pulse: 69 66  Resp: 15 18  Temp: (!) 36.4 C (!) 36.4 C  SpO2: 99% 100%    Last Pain:  Vitals:   12/11/18 1620  TempSrc:   PainSc: 3                  Shelton Silvas

## 2018-12-11 NOTE — Transfer of Care (Signed)
Immediate Anesthesia Transfer of Care Note  Patient: Tanya Mcdaniel  Procedure(s) Performed: ANTERIOR CERVICAL DECOMPRESSION FUSION , CERVICAL SIX-SEVEN WITH INSTRUMENTATION AND ALLOGRAFT (N/A Neck)  Patient Location: PACU  Anesthesia Type:General  Level of Consciousness: awake, alert , oriented, drowsy and patient cooperative  Airway & Oxygen Therapy: Patient Spontanous Breathing and Patient connected to nasal cannula oxygen  Post-op Assessment: Report given to RN and Post -op Vital signs reviewed and stable  Post vital signs: Reviewed and stable  Last Vitals:  Vitals Value Taken Time  BP 130/91 12/11/2018  3:04 PM  Temp    Pulse 89 12/11/2018  3:05 PM  Resp 13 12/11/2018  3:05 PM  SpO2 99 % 12/11/2018  3:05 PM  Vitals shown include unvalidated device data.  Last Pain:  Vitals:   12/11/18 0932  TempSrc:   PainSc: 0-No pain      Patients Stated Pain Goal: 3 (12/11/18 0932)  Complications: No apparent anesthesia complications

## 2018-12-12 ENCOUNTER — Encounter (HOSPITAL_COMMUNITY): Payer: Self-pay | Admitting: Orthopedic Surgery

## 2018-12-12 DIAGNOSIS — M50123 Cervical disc disorder at C6-C7 level with radiculopathy: Secondary | ICD-10-CM | POA: Diagnosis not present

## 2018-12-12 LAB — GLUCOSE, CAPILLARY: Glucose-Capillary: 113 mg/dL — ABNORMAL HIGH (ref 70–99)

## 2018-12-12 MED ORDER — OXYCODONE-ACETAMINOPHEN 5-325 MG PO TABS: 1.0000 | ORAL_TABLET | ORAL | 0 refills | Status: AC | PRN

## 2018-12-12 MED ORDER — DIAZEPAM 5 MG PO TABS: 5.0000 mg | ORAL_TABLET | Freq: Four times a day (QID) | ORAL | 0 refills | Status: AC | PRN

## 2018-12-12 NOTE — Discharge Instructions (Signed)
Anterior Cervical Diskectomy and Fusion, Care After °Refer to this sheet in the next few weeks. These instructions provide you with information about caring for yourself after your procedure. Your health care provider may also give you more specific instructions. Your treatment has been planned according to current medical practices, but problems sometimes occur. Call your health care provider if you have any problems or questions after your procedure. °What can I expect after the procedure? °After the procedure, it is common to have: °· Neck pain. °· Discomfort when swallowing. °· Slight hoarseness. °Follow these instructions at home: °If You Have a Neck Brace: °· Wear it as told by your health care provider. Remove it only as told by your health care provider. °· Keep the brace clean and dry. °Incision care ° °· Follow instructions from your health care provider about how to take care of the cut made during surgery (incision). Make sure you: °? Wash your hands with soap and water before you change your bandage (dressing). If soap and water are not available, use hand sanitizer. °? Change your dressing as told by your health care provider. °? Leave stitches (sutures), skin glue, or adhesive strips in place. These skin closures may need to stay in place for 2 weeks or longer. If adhesive strip edges start to loosen and curl up, you may trim the loose edges. Do not remove adhesive strips completely unless your health care provider tells you to do that. °· Check your incision every day for signs of infection. Watch for: °? Redness, swelling, or pain. °? Fluid or blood. °? Warmth. °? Pus or a bad smell. °Managing pain, stiffness, and swelling °· Take over-the-counter and prescription medicines only as told by your health care provider. °· If directed, apply ice to the injured area. °? Put ice in a plastic bag. °? Place a towel between your skin and the bag. °? Leave the ice on for 20 minutes, 2-3 times per  day. °Activity °· Return to your normal activities as told by your health care provider. Ask your health care provider what activities are safe for you. °· Do exercises as told by your health care provider. °· Do not lift anything that is heavier than 10 lb (4.5 kg). °General instructions °· Do not drive or operate heavy machinery while taking prescription pain medicines. °· Do not use any tobacco products, including cigarettes, chewing tobacco, or e-cigarettes. Tobacco can delay healing. If you need help quitting, ask your health care provider. °· Keep all follow-up visits and physical therapy appointments as told by your health care provider. This is important. °Contact a health care provider if: °· You have a fever. °· You have redness, swelling, or pain around your incision. °· You have fluid or blood coming from your incision. °· You have pus or a bad smell coming from your incision. °· You have pain that is not controlled by your pain medicine. °· You have increasing hoarseness or trouble swallowing. °Get help right away if: °· You have severe pain. °· You have sudden numbness or weakness in your arms. °· You have warmth, tenderness, or swelling in your calf. °· You have chest pain. °· You have difficulty breathing. °This information is not intended to replace advice given to you by your health care provider. Make sure you discuss any questions you have with your health care provider. °Document Released: 10/29/2015 Document Revised: 03/09/2016 Document Reviewed: 09/16/2015 °Elsevier Interactive Patient Education © 2019 [employer] ° °

## 2018-12-12 NOTE — Progress Notes (Signed)
Pt doing well. Pt and daughter given D/C instructions with verbal understanding. Pt's incision is clean and dry with no sign of infection. Pt's IV was removed prior to D/C. Pt D/C'd home via wheelchair @ 1055 per MD order. Pt is stable @ D/C and has no other needs at this time. Rema Fendt, RN

## 2018-12-12 NOTE — Progress Notes (Signed)
    Patient doing well  Denies arm pain Tolerating PO well   Physical Exam: Vitals:   12/12/18 0429 12/12/18 0722  BP: 107/74 (!) 117/92  Pulse: 75 78  Resp: 18 16  Temp: 97.7 F (36.5 C) 98 F (36.7 C)  SpO2: 98% 99%    Neck soft/supple Dressing in place NVI  POD #1 s/p ACDF, doing well  - encourage ambulation - Percocet for pain, Valium for muscle spasms - d/c home today with f/u in 2 weeks

## 2018-12-18 NOTE — Discharge Summary (Signed)
Patient ID: Tanya Mcdaniel MRN: [id number] DOB/AGE: [birth year] 58 y.o.  Admit date: 12/11/2018 Discharge date: 12/12/2018  Admission Diagnoses:  Active Problems:   Radiculopathy   Discharge Diagnoses:  Same  Past Medical History:  Diagnosis Date  . Anemia   . Arthritis   . Carpal tunnel syndrome   . Cervical herniated disc   . Crohn's disease (HCC)   . Crohn's disease (HCC)   . Diabetes mellitus    borderline  . Family history of anesthesia complication    mother difficulty waking up  . GERD (gastroesophageal reflux disease)   . Headache(784.0)   . Hypercholesterolemia   . Hypertension   . Neuromuscular disorder (HCC)   . Peripheral edema   . PONV (postoperative nausea and vomiting)   . Rh incompatibility 1981  . Urticaria 01/2011    Surgeries: Procedure(s): ANTERIOR CERVICAL DECOMPRESSION FUSION , CERVICAL SIX-SEVEN WITH INSTRUMENTATION AND ALLOGRAFT on 12/11/2018   Consultants:  None   Discharged Condition: Improved  Hospital Course: Tanya Mcdaniel is an 58 y.o. female who was admitted 12/11/2018 for operative treatment of radiculopathy. Patient has severe unremitting pain that affects sleep, daily activities, and work/hobbies. After pre-op clearance the patient was taken to the operating room on 12/11/2018 and underwent  Procedure(s): ANTERIOR CERVICAL DECOMPRESSION FUSION , CERVICAL SIX-SEVEN WITH INSTRUMENTATION AND ALLOGRAFT.    Patient was given perioperative antibiotics:  Anti-infectives (From admission, onward)   Start     Dose/Rate Route Frequency Ordered Stop   12/11/18 2000  ceFAZolin (ANCEF) IVPB 2g/100 mL premix     2 g 200 mL/hr over 30 Minutes Intravenous Every 8 hours 12/11/18 1603 12/12/18 0258   12/11/18 1130  ceFAZolin (ANCEF) IVPB 2g/100 mL premix     2 g 200 mL/hr over 30 Minutes Intravenous On call to O.R. 12/11/18 0858 12/11/18 1252   12/11/18 0904  ceFAZolin (ANCEF) 2-4 GM/100ML-% IVPB    Note to Pharmacy:  Lorenda Ishihara   : cabinet  override      12/11/18 0904 12/11/18 1252       Patient was given sequential compression devices, early ambulation to prevent DVT.  Patient benefited maximally from hospital stay and there were no complications.    Recent vital signs: BP (!) 117/92 (BP Location: Right Arm)   Pulse 78   Temp 98 F (36.7 C) (Oral)   Resp 16   SpO2 99%    Discharge Medications:   Allergies as of 12/12/2018      Reactions   Sulfa Antibiotics Rash      Medication List    STOP taking these medications   ASACOL HD 800 MG Tbec Generic drug:  Mesalamine   CALCIUM MAGNESIUM PO   carboxymethylcellulose 0.5 % Soln Commonly known as:  REFRESH PLUS   cholecalciferol 25 MCG (1000 UT) tablet Commonly known as:  VITAMIN D3   cycloSPORINE 0.05 % ophthalmic emulsion Commonly known as:  RESTASIS   diphenhydrAMINE 25 MG tablet Commonly known as:  BENADRYL   ELESTRIN 0.52 MG/0.87 GM (0.06%) Gel Generic drug:  Estradiol   esomeprazole 40 MG capsule Commonly known as:  NEXIUM   ezetimibe-simvastatin 10-20 MG tablet Commonly known as:  VYTORIN   famotidine 20 MG tablet Commonly known as:  PEPCID   folic acid 400 MCG tablet Commonly known as:  FOLVITE   gabapentin 300 MG capsule Commonly known as:  NEURONTIN   levocetirizine 5 MG tablet Commonly known as:  XYZAL   oxyCODONE-acetaminophen 10-325 MG tablet Commonly  known as:  PERCOCET Replaced by:  oxyCODONE-acetaminophen 5-325 MG tablet   progesterone 100 MG capsule Commonly known as:  PROMETRIUM   promethazine 25 MG tablet Commonly known as:  PHENERGAN   SLOW RELEASE IRON 160 (50 Fe) MG Tbcr SR tablet Generic drug:  ferrous sulfate   tamsulosin 0.4 MG Caps capsule Commonly known as:  FLOMAX   valsartan-hydrochlorothiazide 160-25 MG tablet Commonly known as:  DIOVAN-HCT   VITAMIN B-12 IJ   vitamin C 1000 MG tablet   vitamin E 400 UNIT capsule     TAKE these medications   diazepam 5 MG tablet Commonly known as:   VALIUM Take 1 tablet (5 mg total) by mouth every 6 (six) hours as needed for muscle spasms. What changed:    medication strength  when to take this  reasons to take this  additional instructions   oxyCODONE-acetaminophen 5-325 MG tablet Commonly known as:  PERCOCET/ROXICET Take 1-2 tablets by mouth every 4 (four) hours as needed for moderate pain or severe pain. Replaces:  oxyCODONE-acetaminophen 10-325 MG tablet       Diagnostic Studies: Dg Cervical Spine 1 View  Result Date: 12/11/2018 CLINICAL DATA:  Anterior cervical decompression and fusion. EXAM: DG CERVICAL SPINE - 1 VIEW; DG C-ARM 61-120 MIN COMPARISON:  Cervical spine CT from 04/14/2012 FINDINGS: A lateral intraoperative view of the cervical spine demonstrates indistinctness of the bony structures. The original C4-C5-C6 ACDF is noted with interbody spacers although the C6 screws are no longer present. There appears to have been placement of a new C6-7 ACDF with interbody spacer. IMPRESSION: 1. New since C6-7 ACDF noted. The C6 screws from the prior C4-C5-C6 ACDF have been removed. Electronically Signed   By: Gaylyn Rong M.D.   On: 12/11/2018 17:08   Dg C-arm 1-60 Min  Result Date: 12/11/2018 CLINICAL DATA:  Anterior cervical decompression and fusion. EXAM: DG CERVICAL SPINE - 1 VIEW; DG C-ARM 61-120 MIN COMPARISON:  Cervical spine CT from 04/14/2012 FINDINGS: A lateral intraoperative view of the cervical spine demonstrates indistinctness of the bony structures. The original C4-C5-C6 ACDF is noted with interbody spacers although the C6 screws are no longer present. There appears to have been placement of a new C6-7 ACDF with interbody spacer. IMPRESSION: 1. New since C6-7 ACDF noted. The C6 screws from the prior C4-C5-C6 ACDF have been removed. Electronically Signed   By: Gaylyn Rong M.D.   On: 12/11/2018 17:08    Disposition:     POD #1 s/p ACDF, doing well  - encourage ambulation - Percocet for pain,  Valium for muscle spasms sent to pharmacy -D/C instructions sheet printed and in chart -D/C today  -F/U in office 2 weeks   Signed: Eilene Ghazi Devynn Scheff 12/18/2018, 1:15 PM
